# Patient Record
Sex: Female | Born: 1995 | Race: Black or African American | Hispanic: No | Marital: Single | Smoking: Never smoker
Health system: Southern US, Community
[De-identification: ages and names within clinical notes are randomized; demographics above are authoritative.]

## PROBLEM LIST (undated history)

## (undated) DIAGNOSIS — Z789 Other specified health status: Secondary | ICD-10-CM

---

## 2020-05-20 ENCOUNTER — Emergency Department (HOSPITAL_COMMUNITY)
Admission: EM | Admit: 2020-05-20 | Discharge: 2020-05-21 | Disposition: A | Discharge status: Home / Self Care | Payer: Medicaid Other | Attending: Emergency Medicine | Admitting: Emergency Medicine

## 2020-05-20 ENCOUNTER — Emergency Department (HOSPITAL_COMMUNITY): Payer: Medicaid Other

## 2020-05-20 ENCOUNTER — Other Ambulatory Visit: Payer: Self-pay

## 2020-05-20 DIAGNOSIS — R079 Chest pain, unspecified: Secondary | ICD-10-CM | POA: Insufficient documentation

## 2020-05-20 DIAGNOSIS — Z5321 Procedure and treatment not carried out due to patient leaving prior to being seen by health care provider: Secondary | ICD-10-CM | POA: Diagnosis not present

## 2020-05-20 DIAGNOSIS — N946 Dysmenorrhea, unspecified: Secondary | ICD-10-CM | POA: Insufficient documentation

## 2020-05-20 DIAGNOSIS — R0602 Shortness of breath: Secondary | ICD-10-CM | POA: Diagnosis not present

## 2020-05-20 LAB — CBC
HCT: 37.8 % (ref 36.0–46.0)
Hemoglobin: 12.1 g/dL (ref 12.0–15.0)
MCH: 27.9 pg (ref 26.0–34.0)
MCHC: 32 g/dL (ref 30.0–36.0)
MCV: 87.1 fL (ref 80.0–100.0)
Platelets: 245 10*3/uL (ref 150–400)
RBC: 4.34 MIL/uL (ref 3.87–5.11)
RDW: 15.9 % — ABNORMAL HIGH (ref 11.5–15.5)
WBC: 7.8 10*3/uL (ref 4.0–10.5)
nRBC: 0 % (ref 0.0–0.2)

## 2020-05-20 LAB — BASIC METABOLIC PANEL
Anion gap: 7 (ref 5–15)
BUN: 11 mg/dL (ref 6–20)
CO2: 25 mmol/L (ref 22–32)
Calcium: 9.3 mg/dL (ref 8.9–10.3)
Chloride: 106 mmol/L (ref 98–111)
Creatinine, Ser: 0.78 mg/dL (ref 0.44–1.00)
GFR calc Af Amer: >60 mL/min (ref >60)
GFR calc non Af Amer: >60 mL/min (ref >60)
Glucose, Bld: 95 mg/dL (ref 70–99)
Potassium: 4.1 mmol/L (ref 3.5–5.1)
Sodium: 138 mmol/L (ref 135–145)

## 2020-05-20 LAB — TROPONIN I (HIGH SENSITIVITY): Troponin I (High Sensitivity): <2 ng/L (ref <18)

## 2020-05-20 LAB — I-STAT BETA HCG BLOOD, ED (NOT ORDERABLE): I-stat hCG, quantitative: 21.9 m[IU]/mL — ABNORMAL HIGH (ref <5)

## 2020-05-20 LAB — HCG, QUANTITATIVE, PREGNANCY: hCG, Beta Chain, Quant, S: 21 m[IU]/mL — ABNORMAL HIGH (ref <5)

## 2020-05-20 MED ORDER — SODIUM CHLORIDE 0.9% FLUSH: 3.0000 mL | Freq: Once | INTRAVENOUS | Status: DC

## 2020-05-20 NOTE — ED Triage Notes (Signed)
Patient reports to the ER for c/o chest pain. Patient reports she had some SOB x1 week ago but the chest pain is new. Patient reports she is also having period cramps.

## 2021-04-07 ENCOUNTER — Emergency Department (HOSPITAL_COMMUNITY)
Admission: EM | Admit: 2021-04-07 | Discharge: 2021-04-09 | Disposition: A | Discharge status: Other Acute Inpatient Hospital | Payer: Medicaid Other | Attending: Emergency Medicine | Admitting: Emergency Medicine

## 2021-04-07 DIAGNOSIS — F29 Unspecified psychosis not due to a substance or known physiological condition: Secondary | ICD-10-CM | POA: Diagnosis not present

## 2021-04-07 DIAGNOSIS — F309 Manic episode, unspecified: Secondary | ICD-10-CM | POA: Insufficient documentation

## 2021-04-07 DIAGNOSIS — Z20822 Contact with and (suspected) exposure to covid-19: Secondary | ICD-10-CM | POA: Insufficient documentation

## 2021-04-07 DIAGNOSIS — Y900 Blood alcohol level of less than 20 mg/100 ml: Secondary | ICD-10-CM | POA: Diagnosis not present

## 2021-04-07 LAB — COMPREHENSIVE METABOLIC PANEL
ALT: 11 U/L (ref 0–44)
AST: 16 U/L (ref 15–41)
Albumin: 4.2 g/dL (ref 3.5–5.0)
Alkaline Phosphatase: 58 U/L (ref 38–126)
Anion gap: 7 (ref 5–15)
BUN: 10 mg/dL (ref 6–20)
CO2: 23 mmol/L (ref 22–32)
Calcium: 9.2 mg/dL (ref 8.9–10.3)
Chloride: 106 mmol/L (ref 98–111)
Creatinine, Ser: 0.86 mg/dL (ref 0.44–1.00)
GFR, Estimated: >60 mL/min (ref >60)
Glucose, Bld: 108 mg/dL — ABNORMAL HIGH (ref 70–99)
Potassium: 4 mmol/L (ref 3.5–5.1)
Sodium: 136 mmol/L (ref 135–145)
Total Bilirubin: 0.5 mg/dL (ref 0.3–1.2)
Total Protein: 7.6 g/dL (ref 6.5–8.1)

## 2021-04-07 LAB — RESP PANEL BY RT-PCR (FLU A&B, COVID) ARPGX2
Influenza A by PCR: NEGATIVE
Influenza B by PCR: NEGATIVE
SARS Coronavirus 2 by RT PCR: NEGATIVE

## 2021-04-07 LAB — CBC WITH DIFFERENTIAL/PLATELET
Abs Immature Granulocytes: 0.04 10*3/uL (ref 0.00–0.07)
Basophils Absolute: 0 10*3/uL (ref 0.0–0.1)
Basophils Relative: 0 %
Eosinophils Absolute: 0 10*3/uL (ref 0.0–0.5)
Eosinophils Relative: 0 %
HCT: 35.9 % — ABNORMAL LOW (ref 36.0–46.0)
Hemoglobin: 11.7 g/dL — ABNORMAL LOW (ref 12.0–15.0)
Immature Granulocytes: 0 %
Lymphocytes Relative: 15 %
Lymphs Abs: 1.4 10*3/uL (ref 0.7–4.0)
MCH: 28 pg (ref 26.0–34.0)
MCHC: 32.6 g/dL (ref 30.0–36.0)
MCV: 85.9 fL (ref 80.0–100.0)
Monocytes Absolute: 0.7 10*3/uL (ref 0.1–1.0)
Monocytes Relative: 8 %
Neutro Abs: 7.6 10*3/uL (ref 1.7–7.7)
Neutrophils Relative %: 77 %
Platelets: 289 10*3/uL (ref 150–400)
RBC: 4.18 MIL/uL (ref 3.87–5.11)
RDW: 15 % (ref 11.5–15.5)
WBC: 9.9 10*3/uL (ref 4.0–10.5)
nRBC: 0 % (ref 0.0–0.2)

## 2021-04-07 LAB — ETHANOL: Alcohol, Ethyl (B): <10 mg/dL (ref <10)

## 2021-04-07 LAB — I-STAT BETA HCG BLOOD, ED (MC, WL, AP ONLY): I-stat hCG, quantitative: <5 m[IU]/mL (ref <5)

## 2021-04-07 MED ORDER — ZIPRASIDONE MESYLATE 20 MG IM SOLR
20.0000 mg | Freq: Once | INTRAMUSCULAR | Status: AC
  Administered 2021-04-07: 20 mg via INTRAMUSCULAR
  Filled 2021-04-07: qty 20

## 2021-04-07 MED ORDER — STERILE WATER FOR INJECTION IJ SOLN
INTRAMUSCULAR | Status: AC
  Filled 2021-04-07: qty 10

## 2021-04-07 NOTE — ED Notes (Signed)
Patient walking outside and then returning inside, when asked to sit down and wait to be called patient walked back outside.

## 2021-04-07 NOTE — ED Notes (Signed)
Patient at desk making phone call to mother-Monique,RN  ?

## 2021-04-07 NOTE — ED Notes (Signed)
Pts belongings in locker #2 

## 2021-04-07 NOTE — ED Notes (Signed)
Pt is very agitated and aggressive. Pt is having hallucinations. This RN walked pt to [facility] without any issues.

## 2021-04-07 NOTE — ED Provider Notes (Signed)
[facility] EMERGENCY DEPARTMENT Provider Note   CSN: [id number] Arrival date & time: 04/07/21  1540     History No chief complaint on file.   Tammie Parker is a 25 y.o. female.  Presents to ER for psych eval.  Patient having frequent mild outbursts, hyperreligiosity.  States that she is interested in receiving help.  She denies any acute medical complaints but states that she does not feel right.  She denies thoughts of hurting herself or hurting others.  Denies hallucinations.  Does have reported history of bipolar disorder.  History is limited to her psychiatric condition.  HPI     No past medical history on file.  There are no problems to display for this patient.   OB History   No obstetric history on file.     No family history on file.     Home Medications Prior to Admission medications   Not on File    Allergies    Patient has no allergy information on record.  Review of Systems   Review of Systems  Constitutional:  Negative for chills and fever.  HENT:  Negative for ear pain and sore throat.   Eyes:  Negative for pain and visual disturbance.  Respiratory:  Negative for cough and shortness of breath.   Cardiovascular:  Negative for chest pain and palpitations.  Gastrointestinal:  Negative for abdominal pain and vomiting.  Genitourinary:  Negative for dysuria and hematuria.  Musculoskeletal:  Negative for arthralgias and back pain.  Skin:  Negative for color change and rash.  Neurological:  Negative for seizures and syncope.  All other systems reviewed and are negative.  Physical Exam Updated Vital Signs BP (!) 146/93 (BP Location: Right Arm)   Pulse (!) 130   Temp 98.7 F (37.1 C) (Oral)   Resp 18   SpO2 100%   Physical Exam Vitals and nursing note reviewed.  Constitutional:      General: She is not in acute distress.    Appearance: She is well-developed.     Comments: Obvious mania, pressured speech, difficult to  redirect  HENT:     Head: Normocephalic and atraumatic.  Eyes:     Conjunctiva/sclera: Conjunctivae normal.  Cardiovascular:     Rate and Rhythm: Normal rate and regular rhythm.     Heart sounds: No murmur heard. Pulmonary:     Effort: Pulmonary effort is normal. No respiratory distress.     Breath sounds: Normal breath sounds.  Abdominal:     Palpations: Abdomen is soft.     Tenderness: There is no abdominal tenderness.  Musculoskeletal:     Cervical back: Neck supple.  Skin:    General: Skin is warm and dry.  Neurological:     General: No focal deficit present.     Mental Status: She is alert.  Psychiatric:     Comments: Difficult to redirect, pressured speech, psychosis    ED Results / Procedures / Treatments   Labs (all labs ordered are listed, but only abnormal results are displayed) Labs Reviewed  CBC WITH DIFFERENTIAL/PLATELET - Abnormal; Notable for the following components:      Result Value   Hemoglobin 11.7 (*)    HCT 35.9 (*)    All other components within normal limits  RESP PANEL BY RT-PCR (FLU A&B, COVID) ARPGX2  COMPREHENSIVE METABOLIC PANEL  ETHANOL  RAPID URINE DRUG SCREEN, HOSP PERFORMED  I-STAT BETA HCG BLOOD, ED ([facility], [facility], [facility] ONLY)    EKG None  Radiology No results found.  Procedures Procedures   Medications Ordered in ED Medications  ziprasidone (GEODON) injection 20 mg (has no administration in time range)  sterile water (preservative free) injection (has no administration in time range)    ED Course  I have reviewed the triage vital signs and the nursing notes.  Pertinent labs & imaging results that were available during my care of the patient were reviewed by me and considered in my medical decision making (see chart for details).    MDM Rules/Calculators/A&P                          25 year old lady presents to ER with bizarre behavior.  Patient hyper religious, pressured speech, difficult to redirect.  Concern for profoundly  manic state, psychosis.  Denies any acute medical complaints.  We will send basic labs.  Will need TTS evaluation.  Patient is medically stable for psychiatry evaluation.  At present patient is voluntary and willing to receive treatment.  States she is willing to receive medication and willing to stay for psych eval.  If patient attempts to leave, likely will proceed with IVC but given for voluntary status not necessary at present.   Final Clinical Impression(s) / ED Diagnoses Final diagnoses:  Mania (HCC)  Psychosis, unspecified psychosis type [facility])    Rx / DC Orders ED Discharge Orders     None        Milagros Loll, MD 04/07/21 1827

## 2021-04-07 NOTE — ED Provider Notes (Signed)
Emergency Medicine Provider Triage Evaluation Note  Tammie Parker , a 25 y.o. female  was evaluated in triage.  Pt here by EMS from school after hyper religious statements and acting bizarre.  History of bipolar disorder.  She made comments about "reading too many books to know better", screaming "creativity", and "you again" to nobody.  She is crying and screaming.    She reports that she smoked a black and mild but no illicit drugs.  Wine last night, no other alcohol.  Would take trazodone for her bipolar disorder.    Conversations with herself.  Denies HI/SI.  Review of Systems  Positive: Hallucinations Negative: SI/HI  Physical Exam  BP (!) 146/93 (BP Location: Right Arm)   Pulse (!) 130   Temp 98.7 F (37.1 C) (Oral)   Resp 18   SpO2 100%  Gen:   Awake, no distress   Resp:  Normal effort  MSK:   Moves extremities without difficulty  Other:  Pacing around the room.  Anxious.  Responding to internal stimuli.  Screaming. Crying.  Medical Decision Making  Medically screening exam initiated at 4:49 PM.  Appropriate orders placed.  Tammie Parker was informed that the remainder of the evaluation will be completed by another provider, this initial triage assessment does not replace that evaluation, and the importance of remaining in the ED until their evaluation is complete.  Patient is having active psychosis.  Conversations with herself.  Emotionally labile.   Labs pending, but otherwise medically cleared.  Needs psychiatric stabilization.   Tammie New, PA-C 04/07/21 1650    Tammie Bale, MD 04/07/21 2118

## 2021-04-07 NOTE — ED Notes (Signed)
Pt here from [facility], upset, tearful, responding to external stimuli, interactive, labile, episodic echolalia. Agreeable to injection. Diet ordered. Belongings placed in locker #2 (1 bag).

## 2021-04-07 NOTE — ED Triage Notes (Signed)
Pt here from a school with ems drove up to the school and told the principal that god sent her there , pt denis SI/HI but has a hx of biploar and has several load outburst in triage but still cooperative

## 2021-04-08 ENCOUNTER — Other Ambulatory Visit: Payer: Self-pay

## 2021-04-08 LAB — RAPID URINE DRUG SCREEN, HOSP PERFORMED
Amphetamines: NOT DETECTED
Barbiturates: NOT DETECTED
Benzodiazepines: NOT DETECTED
Cocaine: NOT DETECTED
Opiates: NOT DETECTED
Tetrahydrocannabinol: NOT DETECTED

## 2021-04-08 MED ORDER — STERILE WATER FOR INJECTION IJ SOLN
INTRAMUSCULAR | Status: AC
  Filled 2021-04-08: qty 10

## 2021-04-08 MED ORDER — HYDROXYZINE HCL 25 MG PO TABS
25.0000 mg | ORAL_TABLET | Freq: Three times a day (TID) | ORAL | Status: DC | PRN
  Administered 2021-04-08 – 2021-04-09 (×3): 25 mg via ORAL
  Filled 2021-04-08 (×4): qty 1

## 2021-04-08 MED ORDER — ARIPIPRAZOLE 10 MG PO TABS
10.0000 mg | ORAL_TABLET | Freq: Every day | ORAL | Status: DC
  Administered 2021-04-08 – 2021-04-09 (×2): 10 mg via ORAL
  Filled 2021-04-08 (×2): qty 1

## 2021-04-08 MED ORDER — ACETAMINOPHEN 500 MG PO TABS
1000.0000 mg | ORAL_TABLET | Freq: Once | ORAL | Status: AC
  Administered 2021-04-08: 1000 mg via ORAL
  Filled 2021-04-08: qty 2

## 2021-04-08 MED ORDER — OLANZAPINE 5 MG PO TBDP
5.0000 mg | ORAL_TABLET | Freq: Three times a day (TID) | ORAL | Status: DC | PRN
  Administered 2021-04-08 – 2021-04-09 (×3): 5 mg via ORAL
  Filled 2021-04-08 (×3): qty 1

## 2021-04-08 MED ORDER — OLANZAPINE 10 MG IM SOLR: 5.0000 mg | Freq: Three times a day (TID) | INTRAMUSCULAR | Status: DC | PRN

## 2021-04-08 MED ORDER — ZIPRASIDONE MESYLATE 20 MG IM SOLR
20.0000 mg | Freq: Once | INTRAMUSCULAR | Status: AC
  Administered 2021-04-08: 20 mg via INTRAMUSCULAR
  Filled 2021-04-08: qty 20

## 2021-04-08 NOTE — Progress Notes (Signed)
Patient has been referred out due to no bed availability at [facility]. Patient meets inpatient criteria per [name] ,NP. Patient referred to the following facilities:  [facility]  [address] [place] [address] [phone]  [facility]  [address] [place] [address] [phone]  [facility]  [address], [place] [address] [phone]  [facility]  [address] [place] [address] [phone]  [facility]  [address] [place] [address] [phone]  [facility]  [address] [place] [address] [phone]  [facility]  [address], [place] [address] [phone]  [facility]  [address] [place], [place] [address] [phone]  [facility]  [address] [place], [place] [address] [phone]  [facility]  [address] [place] [address] [phone]  [facility]  [address] [place] [address] [phone]  [facility]  [address], [place] [address] [phone]  [facility]  [address], [place] [address] [phone]    CSW will continue to monitor disposition.    Damita Dunnings, MSW, LCSW-A  11:00 AM 04/08/2021

## 2021-04-08 NOTE — Consult Note (Signed)
Tammie Parker Charity fundraiser., verified pt's home medication Abilify 10 mg PO daily with pt. Pt states that the Abilify was effective but was unable to specify the last time she took the medication.  Medications started: Abilify 10 mg PO daily for mood stabilization  Vistaril 25 mg PO PRN TID for anxiety  Zyprexa 5 mg PO or IM for agitation   EKG ordered- patient is prescribed antipsychotics.  Secure chat sent to [facility] with updates.
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
## 2021-04-08 NOTE — ED Notes (Signed)
TTS set up

## 2021-04-08 NOTE — BH Assessment (Signed)
DISPOSITION: Gave clinical report to [name], NP who determined Pt meets criteria for inpatient psychiatric treatment. [name], AC at [facility] is reviewing for an appropriate bed is not currently available. Other facilities will be contacted for placement. Notified Dr. Madilyn Hook and [name] ,RN of disposition recommendation and the sitter utilization recommendation.

## 2021-04-08 NOTE — BH Assessment (Signed)
TTS consult completed 

## 2021-04-08 NOTE — ED Provider Notes (Signed)
Emergency Medicine Observation Re-evaluation Note  Tammie Parker is a 25 y.o. female, seen on rounds today.  Pt initially presented to the ED for complaints of No chief complaint on file. Currently, the patient is sleeping in the room. Nursing reports that she needed Geodon PRN for agitation this morning. Physical Exam  BP (!) 128/91 (BP Location: Right Arm)   Pulse 82   Temp 97.7 F (36.5 C) (Oral)   Resp 20   SpO2 100%  Physical Exam General: NAD Cardiac: RRR Lungs: no respiratory distress Psych: unable to assess - sleeping  ED Course / MDM  EKG:   I have reviewed the labs performed to date as well as medications administered while in observation.  Recent changes in the last 24 hours include Vistaril PRN as well as Zyprexa PRN added for anxiety and agitation.  Plan  Current plan is for psychiatric placement. Patient is  note under full IVC at this time.   Tilden Fossa, MD 04/08/21 1105

## 2021-04-08 NOTE — ED Notes (Signed)
Pt attempted to call mother

## 2021-04-08 NOTE — ED Notes (Signed)
Pt is waxing and waning between calm and tearful, anxiety.  PT is asking for something to help her sleep/stay calm.  Plan to give ODT zyprexa and Vistiril in hopes of helping her.

## 2021-04-08 NOTE — ED Notes (Signed)
Pt provided a care pkg containing eye cover, ear plugs, etc.

## 2021-04-08 NOTE — ED Notes (Signed)
Mother called and number to reach is [phone]

## 2021-04-08 NOTE — Progress Notes (Signed)
Pt accepted to [facility]     Patient meets inpatient criteria per [name], NP  Dr.Thomas Loyola Mast is the attending provider.    Call report to [phone]   [name], RN @ [facility] ED notified.     Pt scheduled  to arrive at [facility] tomorrow after 0800.   Damita Dunnings, MSW, LCSW-A  11:10 AM 04/08/2021

## 2021-04-09 MED ORDER — LORAZEPAM 1 MG PO TABS
1.0000 mg | ORAL_TABLET | Freq: Once | ORAL | Status: AC
  Administered 2021-04-09: 1 mg via ORAL
  Filled 2021-04-09: qty 1

## 2021-04-09 NOTE — ED Notes (Signed)
Attempted report to [facility] several times, was given different numbers to try calling by house supervisor. Admissions states that there is no nurse there with him and that this RN will have to call back after 8AM. Charge RN notified.

## 2021-04-09 NOTE — ED Notes (Signed)
Pt is awake, hungry and impatiently awaiting a snack to assist deescalation.  This RN ok'd snack as there are no meds to be given at this time and pt is not in a reasonable state at this time.

## 2021-04-09 NOTE — ED Notes (Signed)
Attempted report to [facility] again. Callback number provided.

## 2021-04-09 NOTE — ED Notes (Signed)
Patient found standing in room with head in sink. This RN asked patient what she was doing and patient stated she just wanted to wash her hair. This RN told patient she could have a shower once it was no longer occupied by another patient. Patient agreeable.

## 2021-04-09 NOTE — ED Notes (Signed)
Attempted to call report to [facility] several times with no answer. Will attempt again soon.

## 2021-04-09 NOTE — ED Notes (Signed)
Pt was becoming quite agitated and paranoid and was beginning to curse at myself and the sitter.  PT was asking for medicine.  This RN gave Vistiril at 2145 and informed pt that she would not be able to receive ODT Zyprexa for another hour or so.

## 2021-04-09 NOTE — ED Notes (Addendum)
Pt has been up and down all afternoon and evening. She sings, she raps, she cries, she laughs, she claps.  She tends to be a little paranoid and easily startled or frustrated, but is generally redirectable.  Pt agitation has improved since receiving Vistiril but is still very active.  Will plan to give Zyprexa soon in hopes of encouraging sleep for the night.

## 2021-04-09 NOTE — ED Provider Notes (Signed)
Emergency Medicine Observation Re-evaluation Note  Tammie Parker is a 25 y.o. female, seen on rounds today.  Pt initially presented to the ED for complaints of No chief complaint on file. Currently, the patient is awake and sitting at the bedside.  Physical Exam  BP (!) 146/89   Pulse (!) 103   Temp 97.6 F (36.4 C) (Oral)   Resp 18   Ht 5\' 9"  (1.753 m)   Wt 80.7 kg   SpO2 97%   BMI 26.27 kg/m  Physical Exam General: sitting on bedside, anxious Cardiac: RRR Lungs: no respiratory distress Psych: anxious, tearful  ED Course / MDM  EKG:   I have reviewed the labs performed to date as well as medications administered while in observation.  Recent changes in the last 24 hours include none.  Plan  Current plan is for transfer to [facility] for ongoing psychiatric care. Patient is not under full IVC at this time.   [facility], MD 04/09/21 [phone]

## 2021-04-09 NOTE — ED Notes (Signed)
Patient back and forth to restroom and out of room; sitting in chair in hallway, clapping hands and singing. PRN hydroxyzine administered.

## 2021-12-14 IMAGING — CR DG CHEST 2V
2 series · 2 of 2 positions shown · non-contrast
Comparison: None.

CLINICAL DATA: Chest pain

EXAM:
CHEST - 2 VIEW

[w chest pa]
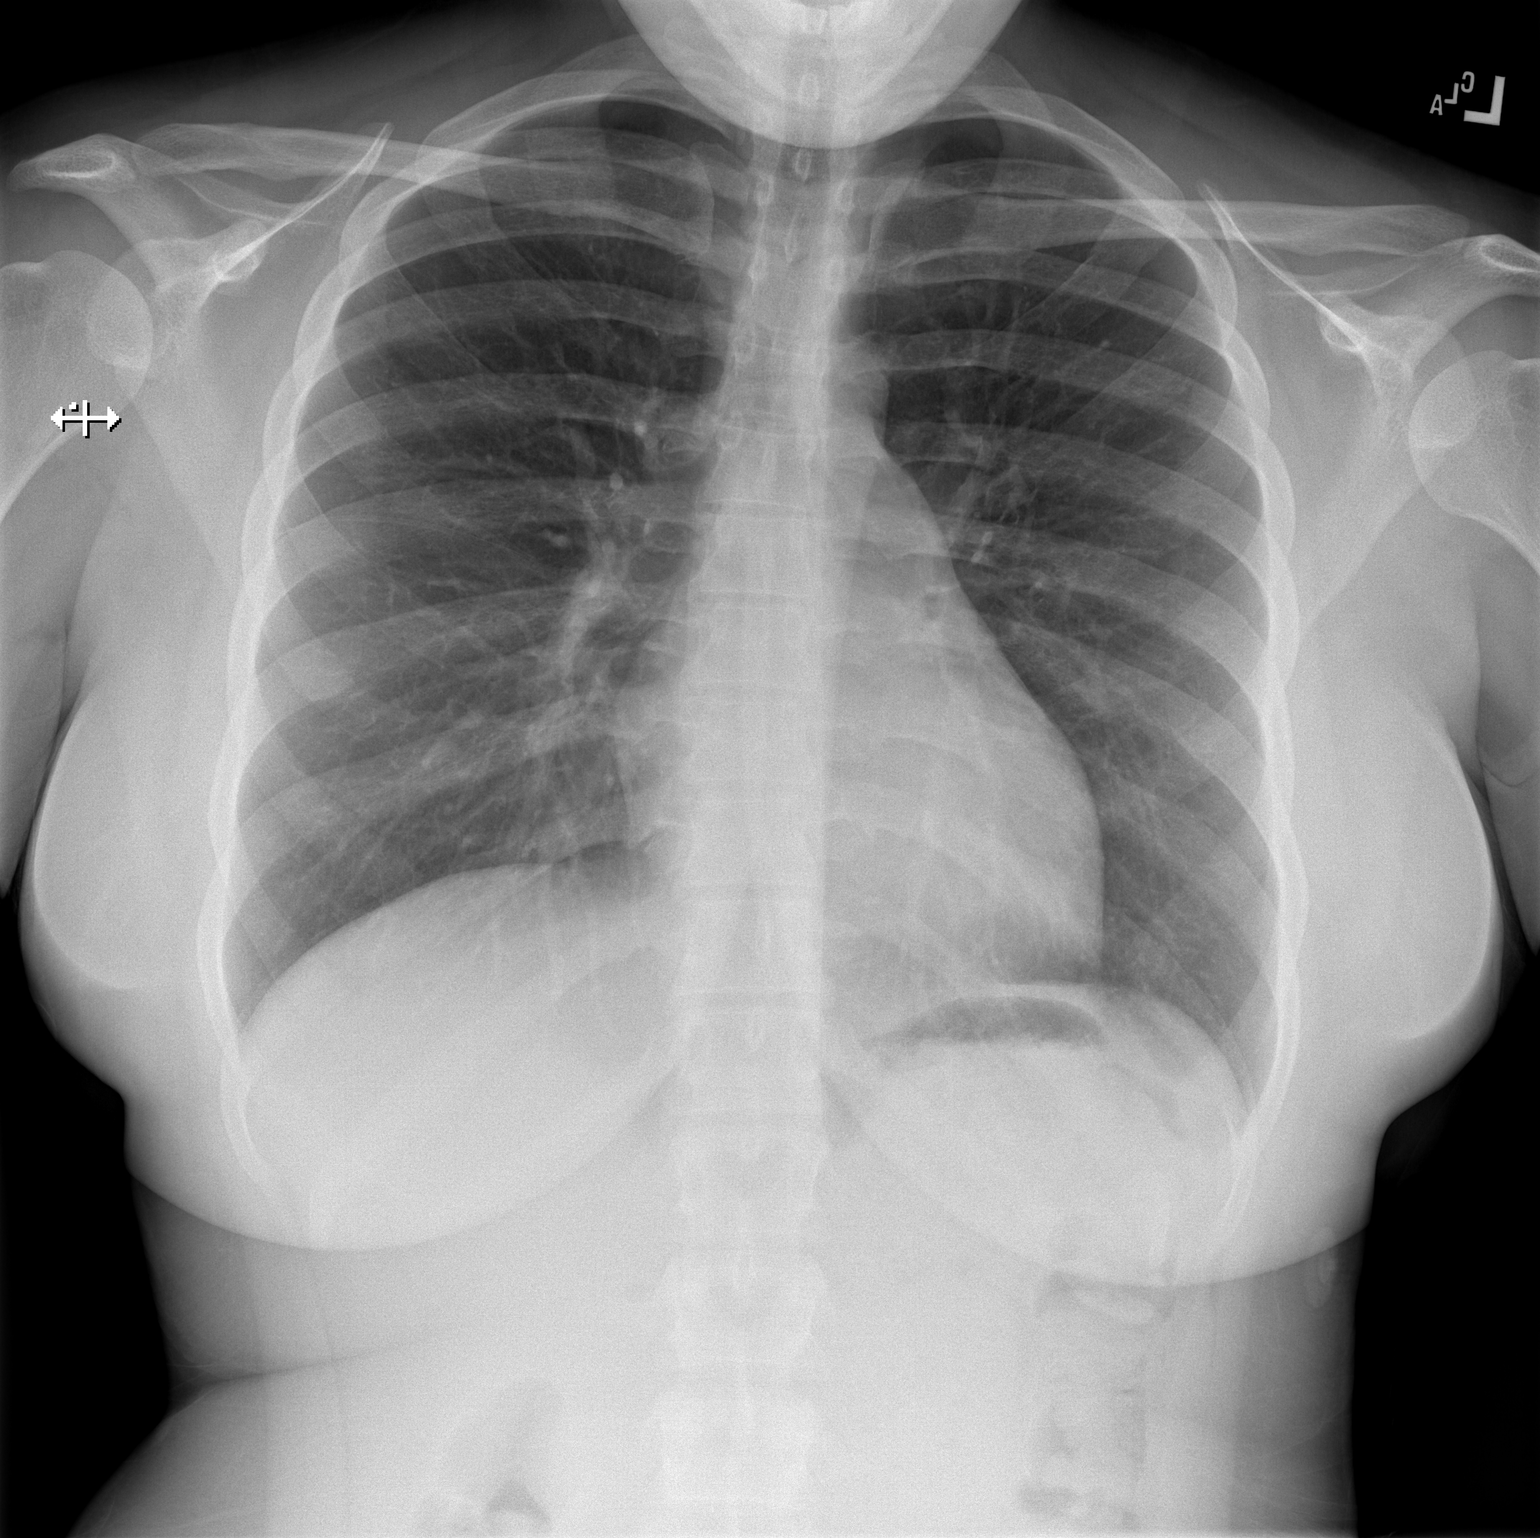

[w chest lat]
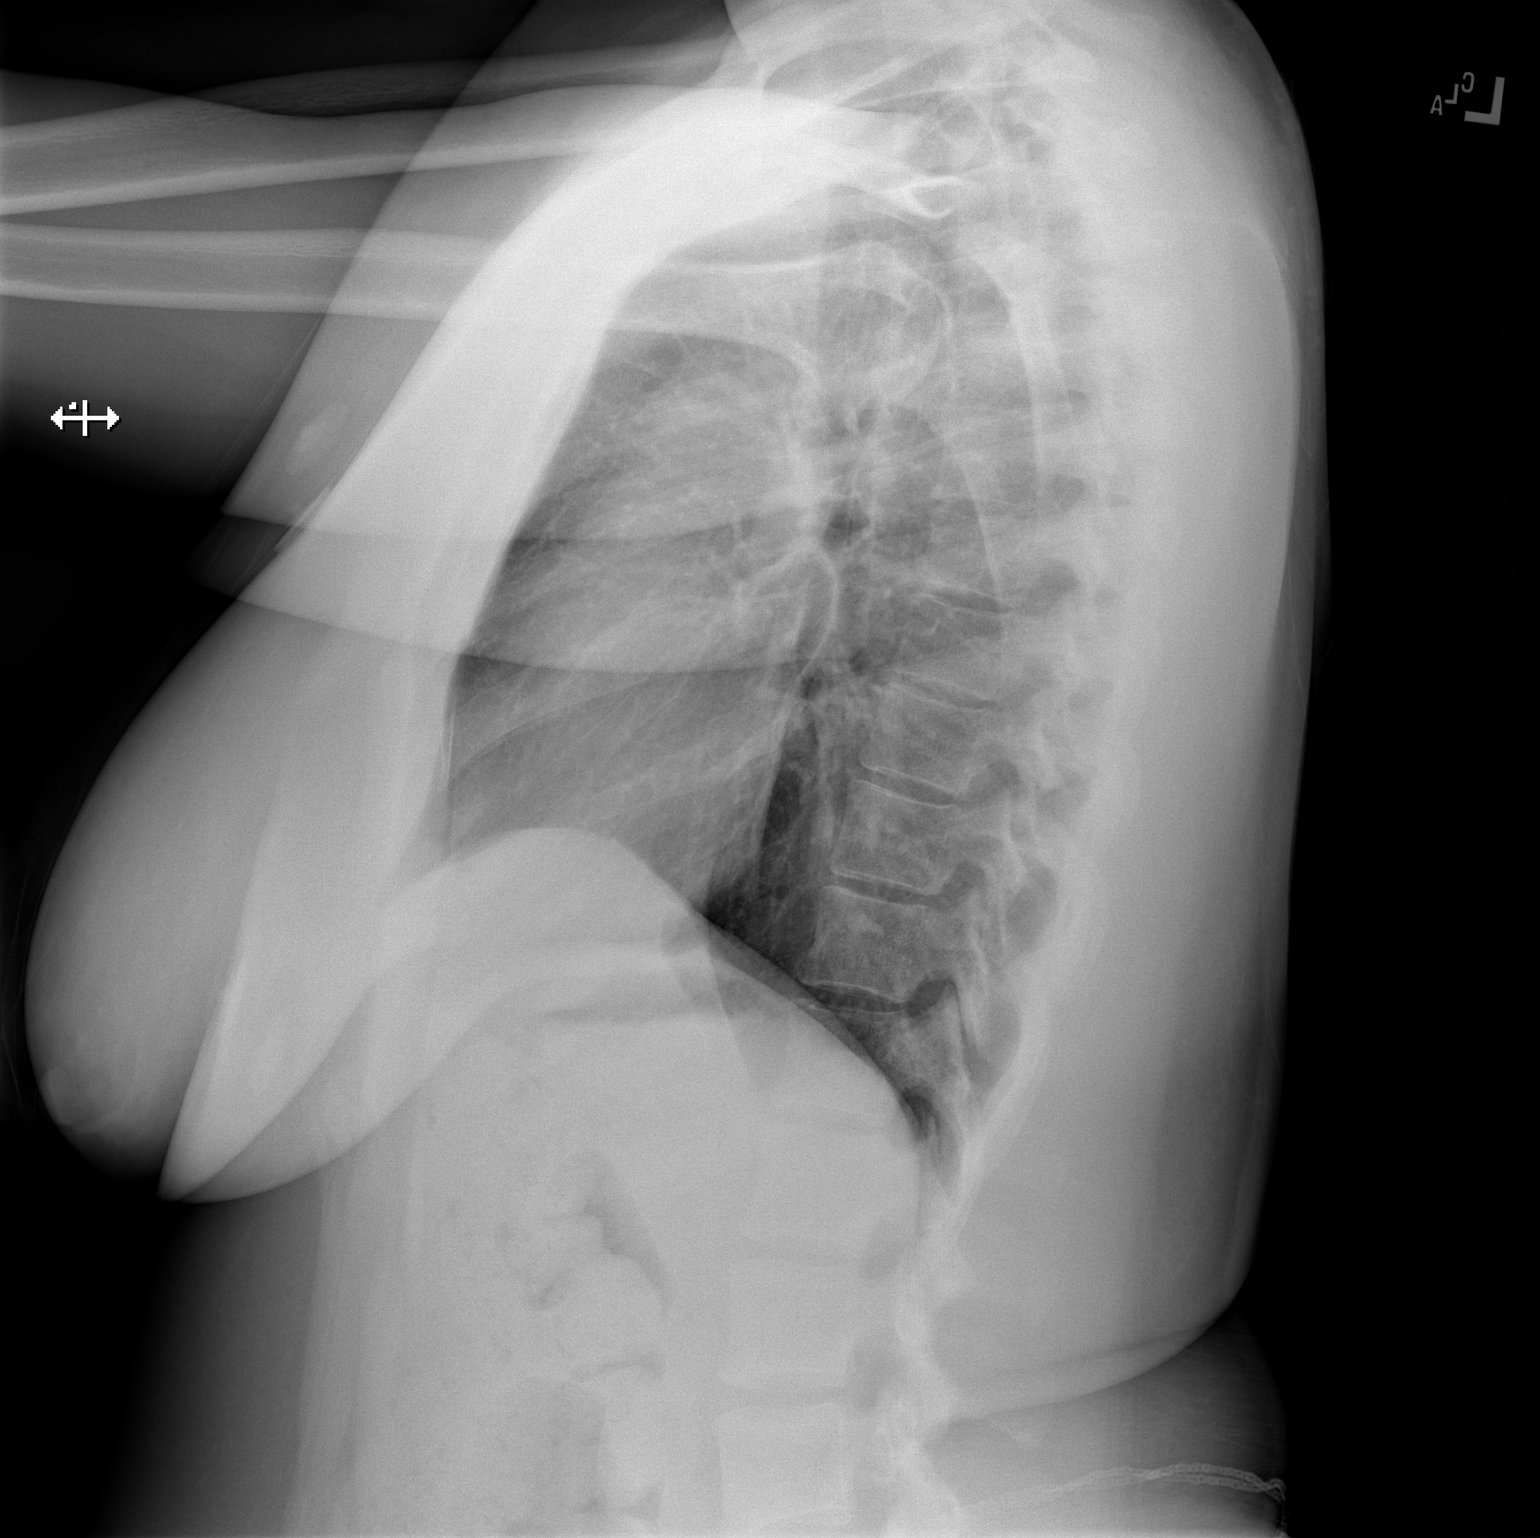

[2 of 2 positions shown; findings below may reference images not displayed]

FINDINGS: Lungs are clear. Heart size and pulmonary vascularity are normal. No
adenopathy. No pneumothorax. No bone lesions.
IMPRESSION: Lungs clear.  Cardiac silhouette normal.

## 2022-04-18 ENCOUNTER — Emergency Department (HOSPITAL_COMMUNITY)
Admission: EM | Admit: 2022-04-18 | Discharge: 2022-04-19 | Disposition: A | Discharge status: Home / Self Care | Payer: Medicaid Other | Attending: Emergency Medicine | Admitting: Emergency Medicine

## 2022-04-18 DIAGNOSIS — Z20822 Contact with and (suspected) exposure to covid-19: Secondary | ICD-10-CM | POA: Diagnosis not present

## 2022-04-18 DIAGNOSIS — F309 Manic episode, unspecified: Secondary | ICD-10-CM | POA: Insufficient documentation

## 2022-04-18 DIAGNOSIS — R45851 Suicidal ideations: Secondary | ICD-10-CM | POA: Insufficient documentation

## 2022-04-18 DIAGNOSIS — F19959 Other psychoactive substance use, unspecified with psychoactive substance-induced psychotic disorder, unspecified: Secondary | ICD-10-CM | POA: Diagnosis not present

## 2022-04-18 DIAGNOSIS — F29 Unspecified psychosis not due to a substance or known physiological condition: Secondary | ICD-10-CM | POA: Diagnosis present

## 2022-04-18 LAB — RAPID URINE DRUG SCREEN, HOSP PERFORMED
Amphetamines: POSITIVE — AB
Barbiturates: NOT DETECTED
Benzodiazepines: NOT DETECTED
Cocaine: NOT DETECTED
Opiates: NOT DETECTED
Tetrahydrocannabinol: POSITIVE — AB

## 2022-04-18 LAB — CBC WITH DIFFERENTIAL/PLATELET
Abs Immature Granulocytes: 0.03 10*3/uL (ref 0.00–0.07)
Basophils Absolute: 0 10*3/uL (ref 0.0–0.1)
Basophils Relative: 0 %
Eosinophils Absolute: 0 10*3/uL (ref 0.0–0.5)
Eosinophils Relative: 0 %
HCT: 34.1 % — ABNORMAL LOW (ref 36.0–46.0)
Hemoglobin: 11.4 g/dL — ABNORMAL LOW (ref 12.0–15.0)
Immature Granulocytes: 0 %
Lymphocytes Relative: 14 %
Lymphs Abs: 1.2 10*3/uL (ref 0.7–4.0)
MCH: 29.5 pg (ref 26.0–34.0)
MCHC: 33.4 g/dL (ref 30.0–36.0)
MCV: 88.1 fL (ref 80.0–100.0)
Monocytes Absolute: 0.5 10*3/uL (ref 0.1–1.0)
Monocytes Relative: 6 %
Neutro Abs: 6.8 10*3/uL (ref 1.7–7.7)
Neutrophils Relative %: 80 %
Platelets: 261 10*3/uL (ref 150–400)
RBC: 3.87 MIL/uL (ref 3.87–5.11)
RDW: 14.1 % (ref 11.5–15.5)
WBC: 8.5 10*3/uL (ref 4.0–10.5)
nRBC: 0 % (ref 0.0–0.2)

## 2022-04-18 LAB — I-STAT BETA HCG BLOOD, ED (MC, WL, AP ONLY): I-stat hCG, quantitative: <5 m[IU]/mL (ref <5)

## 2022-04-18 LAB — COMPREHENSIVE METABOLIC PANEL
ALT: 21 U/L (ref 0–44)
AST: 24 U/L (ref 15–41)
Albumin: 4.2 g/dL (ref 3.5–5.0)
Alkaline Phosphatase: 51 U/L (ref 38–126)
Anion gap: 7 (ref 5–15)
BUN: 8 mg/dL (ref 6–20)
CO2: 20 mmol/L — ABNORMAL LOW (ref 22–32)
Calcium: 8.9 mg/dL (ref 8.9–10.3)
Chloride: 113 mmol/L — ABNORMAL HIGH (ref 98–111)
Creatinine, Ser: 0.73 mg/dL (ref 0.44–1.00)
GFR, Estimated: >60 mL/min (ref >60)
Glucose, Bld: 105 mg/dL — ABNORMAL HIGH (ref 70–99)
Potassium: 3.2 mmol/L — ABNORMAL LOW (ref 3.5–5.1)
Sodium: 140 mmol/L (ref 135–145)
Total Bilirubin: 0.6 mg/dL (ref 0.3–1.2)
Total Protein: 7.4 g/dL (ref 6.5–8.1)

## 2022-04-18 LAB — RESP PANEL BY RT-PCR (FLU A&B, COVID) ARPGX2
Influenza A by PCR: NEGATIVE
Influenza B by PCR: NEGATIVE
SARS Coronavirus 2 by RT PCR: NEGATIVE

## 2022-04-18 LAB — ETHANOL: Alcohol, Ethyl (B): <10 mg/dL (ref <10)

## 2022-04-18 MED ORDER — LORAZEPAM 2 MG/ML IJ SOLN
1.0000 mg | Freq: Once | INTRAMUSCULAR | Status: AC
  Administered 2022-04-18: 1 mg via INTRAMUSCULAR
  Filled 2022-04-18: qty 1

## 2022-04-18 MED ORDER — HALOPERIDOL LACTATE 5 MG/ML IJ SOLN: 5.0000 mg | Freq: Once | INTRAMUSCULAR | Status: DC

## 2022-04-18 MED ORDER — LORAZEPAM 2 MG/ML IJ SOLN: 1.0000 mg | Freq: Once | INTRAMUSCULAR | Status: DC

## 2022-04-18 NOTE — ED Provider Notes (Signed)
[facility]-EMERGENCY DEPT Provider Note   CSN: [id number] Arrival date & time: 04/18/22  2025     History  Chief Complaint  Patient presents with   Psychiatric Evaluation    Tammie Parker is a 26 y.o. female.  Patient here with suicidal thoughts.  Admits to drinking alcohol last night.  Was given Haldol by EMS for some agitation.  History of schizophrenia.  Patient denies any chest pain or shortness of breath or abdominal pain.  Has no specific plan to hurt herself.  Nothing makes it worse or better.  She states she lives by herself.  She denies any other drug use.  Denies taking any pills or anything else to hurt herself.  The history is provided by the patient.       Home Medications Prior to Admission medications   Medication Sig Start Date End Date Taking? Authorizing Provider  ARIPiprazole (ABILIFY) 10 MG tablet Take 10 mg by mouth daily. Patient not taking: No sig reported 03/07/21   [provider]  melatonin 3 MG TABS tablet Take 3 mg by mouth at bedtime. 03/07/21   [provider]  traZODone (DESYREL) 50 MG tablet Take 50 mg by mouth at bedtime. 03/07/21   [provider]      Allergies    Ziprasidone hcl    Review of Systems   Review of Systems  Physical Exam Updated Vital Signs BP 127/71   Pulse 89   Temp 98.3 F (36.8 C) (Oral)   Resp 18   Ht 5\' 9"  (1.753 m)   Wt 109.8 kg   SpO2 100%   BMI 35.74 kg/m  Physical Exam Vitals and nursing note reviewed.  Constitutional:      General: She is not in acute distress.    Appearance: She is well-developed.  HENT:     Head: Normocephalic and atraumatic.  Eyes:     Extraocular Movements: Extraocular movements intact.     Conjunctiva/sclera: Conjunctivae normal.     Pupils: Pupils are equal, round, and reactive to light.  Cardiovascular:     Rate and Rhythm: Normal rate and regular rhythm.     Pulses: Normal pulses.     Heart sounds: No murmur  heard. Pulmonary:     Effort: Pulmonary effort is normal. No respiratory distress.     Breath sounds: Normal breath sounds.  Abdominal:     Palpations: Abdomen is soft.     Tenderness: There is no abdominal tenderness.  Musculoskeletal:        General: No swelling.     Cervical back: Neck supple.  Skin:    General: Skin is warm and dry.     Capillary Refill: Capillary refill takes less than 2 seconds.  Neurological:     Mental Status: She is alert.  Psychiatric:     Comments: Suicidal, mildly sedated     ED Results / Procedures / Treatments   Labs (all labs ordered are listed, but only abnormal results are displayed) Labs Reviewed  COMPREHENSIVE METABOLIC PANEL - Abnormal; Notable for the following components:      Result Value   Potassium 3.2 (*)    Chloride 113 (*)    CO2 20 (*)    Glucose, Bld 105 (*)    All other components within normal limits  RAPID URINE DRUG SCREEN, HOSP PERFORMED - Abnormal; Notable for the following components:   Amphetamines POSITIVE (*)    Tetrahydrocannabinol POSITIVE (*)    All other components within  normal limits  CBC WITH DIFFERENTIAL/PLATELET - Abnormal; Notable for the following components:   Hemoglobin 11.4 (*)    HCT 34.1 (*)    All other components within normal limits  RESP PANEL BY RT-PCR (FLU A&B, COVID) ARPGX2  ETHANOL  I-STAT BETA HCG BLOOD, ED ([facility], [facility], [facility] ONLY)    EKG None  Radiology No results found.  Procedures Procedures    Medications Ordered in ED Medications  haloperidol lactate (HALDOL) injection 5 mg (has no administration in time range)  LORazepam (ATIVAN) injection 1 mg (has no administration in time range)    ED Course/ Medical Decision Making/ A&P                           Medical Decision Making Amount and/or Complexity of Data Reviewed Labs: ordered.  Risk Prescription drug management.   Tammie Parker is here with suicidal ideation.  Appears to have history of schizophrenia.  Admits  alcohol use today.  Normal vitals.  Neurologically she appears to be intact.  No signs of trauma.  She had Haldol with EMS due to agitation.  Overall she appears mildly intoxicated.  We will check medical clearance labs including CBC, CMP, drug screen, ethanol level.  I have no concern for traumatic processes at this time.  She appears medically stable.  We will get screening labs and have her seen by psychiatry.  She is voluntary at this time.  Per my review and interpretation of labs there is no significant electrolyte abnormality, kidney injury, leukocytosis.  Medically cleared.  We will have psychiatry evaluate.  Positive for amphetamines and marijuana.  Does take Adderall.  This chart was dictated using voice recognition software.  Despite best efforts to proofread,  errors can occur which can change the documentation meaning.         Final Clinical Impression(s) / ED Diagnoses Final diagnoses:  Suicidal ideation    Rx / DC Orders ED Discharge Orders     None         Virgina Norfolk, DO 04/18/22 4315

## 2022-04-18 NOTE — ED Notes (Signed)
Pt removed IV.

## 2022-04-18 NOTE — ED Triage Notes (Signed)
Ems brings pt in for psych evaluation. Pt reports hallucinations.

## 2022-04-18 NOTE — ED Notes (Signed)
Pt belongings in 5-8 cabinet

## 2022-04-18 NOTE — Consult Note (Cosign Needed Addendum)
Attempt to evaluate patient failed due to inability to arouse.  Mother at the bed side said she was given medication -Haldol on her way to the ER.  Will try later to assess.

## 2022-04-18 NOTE — ED Notes (Signed)
Pt given lunch tray.

## 2022-04-19 DIAGNOSIS — F19959 Other psychoactive substance use, unspecified with psychoactive substance-induced psychotic disorder, unspecified: Secondary | ICD-10-CM | POA: Diagnosis present

## 2022-04-19 NOTE — ED Notes (Signed)
AVS provided to and discussed with patient. Pt verbalizes understanding of discharge instructions and denies any questions or concerns at this time. Pt has ride home. Pt ambulated out of department independently with steady gait.  

## 2022-04-19 NOTE — ED Notes (Signed)
Patient was given her breakfast tray.

## 2022-04-19 NOTE — Progress Notes (Signed)
Chaplain engaged in a conversation with [facility] when she became disturbed by noise on the unit.  She is feeling better and is looking forward to being at home.  She said she needs to reflect on all that has happened and plans to get a therapist when she discharges.  Chaplain inquired if she had been to a therapist before and she said no.  Chaplain asked her if she needed resources.  She said that she and her mother would figure it out. Chaplain provided listening and emotional support  Chaplain Dyanne Carrel, Bcc Pager, [phone]

## 2022-04-19 NOTE — ED Provider Notes (Signed)
Evaluated psychiatry team.  Cleared for continued outpatient care.  Advised return for worsening symptoms or any additional concerns.   Cheryll Cockayne, MD 04/19/22 1146

## 2022-04-19 NOTE — Discharge Instructions (Signed)
For your behavioral health needs you are advised to follow up with [facility].  They offer psychiatry and therapy.  They also offer a Substance Abuse Intensive Outpatient Program (SA-IOP) to treat substance use disorders.  Contact them at your earliest opportunity to schedule an intake appointment:       [facility]      [address] [place], [place] [address]      [phone]

## 2022-04-19 NOTE — BH Assessment (Signed)
[facility] Assessment Progress Note   Per Dahlia Byes, NP, this voluntary pt does not require psychiatric hospitalization at this time.  Pt is psychiatrically cleared.  Discharge instructions advise pt to follow up with the [facility] for psychiatry, therapy and SA-IOP at her earliest opportunity.  EDP Norman Clay, MD and pt's nurse, [place], have been notified.  Doylene Canning, MA Triage Specialist [phone]

## 2022-04-19 NOTE — BH Assessment (Signed)
Clinician messaged Kavin Leech. Tiburcio Pea, RN: "Hey. It's Trey with TTS. Is the pt able to engage in the assessment, if so the pt will need to be placed in a private room. Is the pt under IVC? Also is the pt medically cleared?"   Clinician awaiting response.    Redmond Pulling, MS, [facility], [facility] Triage Specialist [phone]
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
## 2022-04-19 NOTE — ED Notes (Signed)
Mother called, will bring patient's belongings and clothes to go home in. Mother to pick up patient at 12:30. Pt updated.
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
## 2022-09-07 NOTE — H&P (Addendum)
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   Glucose, POC 111 70 - 179 mg/dL  POCT Glucose  Result Value Ref Range   Glucose, POC 110 70 - 179 mg/dL  POCT Glucose  Result Value Ref Range   Glucose, POC 128 70 - 179 mg/dL  CBC w/ Differential  Result Value Ref Range   WBC 25.0 (H) 3.6 - 11.2 10*9/L   RBC 3.57 (L) 3.95 - 5.13 10*12/L   HGB 10.2 (L) 11.3 - 14.9 g/dL   HCT 69.3 (L) 65.9 - 55.9 %   MCV 85.8 77.6 - 95.7 fL   MCH 28.6 25.9 - 32.4 pg   MCHC 33.3 32.0 - 36.0 g/dL   RDW 85.2 87.7 - 84.7 %   MPV 8.6 6.8 - 10.7 fL   Platelet 317 150 - 450 10*9/L   Neutrophils % 85.7 %   Lymphocytes % 9.7 %   Monocytes % 4.4 %   Eosinophils % 0.1 %  Basophils % 0.1 %   Absolute Neutrophils 21.4 (H) 1.8 - 7.8 10*9/L   Absolute Lymphocytes 2.4 1.1 - 3.6 10*9/L   Absolute Monocytes 1.1 (H) 0.3 - 0.8 10*9/L   Absolute Eosinophils 0.0 0.0 - 0.5 10*9/L   Absolute Basophils 0.0 0.0 - 0.1 10*9/L     Psychometrics: To be completed per unit protocol

## 2022-09-16 NOTE — Discharge Summary (Signed)
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 Health http://www.maynard.net/  Substance Abuse and Mental Health Services Administration skateoasis.com.pt   -For immediate concerns, call 911 for emergency medical attention.  -Emergency services are available 24 hours a day at [facility] to get assistance in deciding appropriate care during periods of psychiatric concern.  -Consider a psychiatric advanced directive at http://nrc-pad.org/     Follow Up instructions and Outpatient Referrals    Ambulatory referral to Neurology     Discharge instructions     Ambulatory referral to Dentistry     To what location is this patient being referred?: [facility]  (Dentistry[facility] [facility]   Do you want ongoing co-management?: No   Care coordination required?: No    Appointments which have been scheduled for you    Oct 19, 2022  1:30 PM (Arrive by 1:15 PM) US  RENAL COMPLETE with [facility] US  RM 2 IMG ULTRASOUND RENNIE [facility]) [address] [place] [address] [phone]      Oct 19, 2022  2:30 PM (Arrive by 2:15 PM) RETURN GENERAL with Aimee Almarie Cassette, PA [facility] UROLOGY [facility]) [address]rd Floor [place] [address] [phone]    Additional instructions:   [facility] follow  Virtual 09/19/22 1:30pm [address] [place] Phone [phone] Fax [phone] Virtual link is being sent to your phone. If this is an issue you may also walk in Monday-Friday at 7:15am at this location for initiation of services       Patient was seen and plan of care was discussed with the Attending MD, Dr. Macy , who agrees with the above statement and plan.  I spent greater than 30 minutes in the discharge of this patient.  Dozier Lance, MD PGY-1 Resident, Psychiatry [facility] 

## 2022-09-19 ENCOUNTER — Telehealth (INDEPENDENT_AMBULATORY_CARE_PROVIDER_SITE_OTHER): Payer: Medicaid Other | Admitting: Psychiatry

## 2022-09-19 ENCOUNTER — Encounter (HOSPITAL_COMMUNITY): Payer: Self-pay | Admitting: Psychiatry

## 2022-09-19 DIAGNOSIS — F209 Schizophrenia, unspecified: Secondary | ICD-10-CM | POA: Diagnosis not present

## 2022-09-19 MED ORDER — OLANZAPINE 10 MG PO TABS: 10.0000 mg | ORAL_TABLET | Freq: Every day | ORAL | 3 refills | Status: DC
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
## 2022-12-15 ENCOUNTER — Telehealth (HOSPITAL_COMMUNITY): Payer: Medicaid Other | Admitting: Student in an Organized Health Care Education/Training Program

## 2022-12-15 ENCOUNTER — Encounter (HOSPITAL_COMMUNITY): Payer: Self-pay
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
## 2023-04-17 ENCOUNTER — Ambulatory Visit (HOSPITAL_COMMUNITY)
Admission: EM | Admit: 2023-04-17 | Discharge: 2023-04-17 | Disposition: A | Discharge status: Other Acute Inpatient Hospital | Payer: MEDICAID | Attending: Family | Admitting: Family

## 2023-04-17 ENCOUNTER — Encounter (HOSPITAL_COMMUNITY): Payer: Self-pay | Admitting: Psychiatry

## 2023-04-17 ENCOUNTER — Other Ambulatory Visit: Payer: Self-pay

## 2023-04-17 ENCOUNTER — Inpatient Hospital Stay (HOSPITAL_COMMUNITY)
Admission: AD | Admit: 2023-04-17 | Discharge: 2023-04-23 | DRG: 885 | Disposition: A | Discharge status: Home / Self Care | Payer: MEDICAID | Source: Intra-hospital | Attending: Psychiatry | Admitting: Psychiatry

## 2023-04-17 DIAGNOSIS — Z79899 Other long term (current) drug therapy: Secondary | ICD-10-CM | POA: Diagnosis not present

## 2023-04-17 DIAGNOSIS — F19959 Other psychoactive substance use, unspecified with psychoactive substance-induced psychotic disorder, unspecified: Secondary | ICD-10-CM | POA: Diagnosis not present

## 2023-04-17 DIAGNOSIS — F17210 Nicotine dependence, cigarettes, uncomplicated: Secondary | ICD-10-CM | POA: Diagnosis present

## 2023-04-17 DIAGNOSIS — F411 Generalized anxiety disorder: Secondary | ICD-10-CM | POA: Insufficient documentation

## 2023-04-17 DIAGNOSIS — F4310 Post-traumatic stress disorder, unspecified: Secondary | ICD-10-CM | POA: Diagnosis present

## 2023-04-17 DIAGNOSIS — Z91199 Patient's noncompliance with other medical treatment and regimen due to unspecified reason: Secondary | ICD-10-CM | POA: Diagnosis not present

## 2023-04-17 DIAGNOSIS — F840 Autistic disorder: Secondary | ICD-10-CM | POA: Diagnosis present

## 2023-04-17 DIAGNOSIS — Z5986 Financial insecurity: Secondary | ICD-10-CM | POA: Diagnosis not present

## 2023-04-17 DIAGNOSIS — F319 Bipolar disorder, unspecified: Secondary | ICD-10-CM | POA: Insufficient documentation

## 2023-04-17 DIAGNOSIS — R45851 Suicidal ideations: Secondary | ICD-10-CM | POA: Diagnosis present

## 2023-04-17 DIAGNOSIS — F250 Schizoaffective disorder, bipolar type: Secondary | ICD-10-CM | POA: Diagnosis present

## 2023-04-17 DIAGNOSIS — F209 Schizophrenia, unspecified: Secondary | ICD-10-CM | POA: Insufficient documentation

## 2023-04-17 HISTORY — DX: Other specified health status: Z78.9

## 2023-04-17 LAB — CBC WITH DIFFERENTIAL/PLATELET
Abs Immature Granulocytes: 0.03 10*3/uL (ref 0.00–0.07)
Basophils Absolute: 0 10*3/uL (ref 0.0–0.1)
Basophils Relative: 0 %
Eosinophils Absolute: 0 10*3/uL (ref 0.0–0.5)
Eosinophils Relative: 0 %
HCT: 36.5 % (ref 36.0–46.0)
Hemoglobin: 12.1 g/dL (ref 12.0–15.0)
Immature Granulocytes: 0 %
Lymphocytes Relative: 14 %
Lymphs Abs: 1.1 10*3/uL (ref 0.7–4.0)
MCH: 28.9 pg (ref 26.0–34.0)
MCHC: 33.2 g/dL (ref 30.0–36.0)
MCV: 87.1 fL (ref 80.0–100.0)
Monocytes Absolute: 0.3 10*3/uL (ref 0.1–1.0)
Monocytes Relative: 4 %
Neutro Abs: 6.1 10*3/uL (ref 1.7–7.7)
Neutrophils Relative %: 82 %
Platelets: 280 10*3/uL (ref 150–400)
RBC: 4.19 MIL/uL (ref 3.87–5.11)
RDW: 14.7 % (ref 11.5–15.5)
WBC: 7.5 10*3/uL (ref 4.0–10.5)
nRBC: 0 % (ref 0.0–0.2)

## 2023-04-17 LAB — POCT URINE DRUG SCREEN - MANUAL ENTRY (I-SCREEN)
POC Amphetamine UR: NOT DETECTED
POC Buprenorphine (BUP): NOT DETECTED
POC Cocaine UR: NOT DETECTED
POC Marijuana UR: POSITIVE — AB
POC Methadone UR: NOT DETECTED
POC Methamphetamine UR: NOT DETECTED
POC Morphine: NOT DETECTED
POC Oxazepam (BZO): NOT DETECTED
POC Oxycodone UR: NOT DETECTED
POC Secobarbital (BAR): NOT DETECTED

## 2023-04-17 LAB — COMPREHENSIVE METABOLIC PANEL
ALT: 21 U/L (ref 0–44)
AST: 24 U/L (ref 15–41)
Albumin: 4.2 g/dL (ref 3.5–5.0)
Alkaline Phosphatase: 64 U/L (ref 38–126)
Anion gap: 8 (ref 5–15)
BUN: 9 mg/dL (ref 6–20)
CO2: 22 mmol/L (ref 22–32)
Calcium: 9.4 mg/dL (ref 8.9–10.3)
Chloride: 106 mmol/L (ref 98–111)
Creatinine, Ser: 0.78 mg/dL (ref 0.44–1.00)
GFR, Estimated: >60 mL/min (ref >60)
Glucose, Bld: 127 mg/dL — ABNORMAL HIGH (ref 70–99)
Potassium: 3.6 mmol/L (ref 3.5–5.1)
Sodium: 136 mmol/L (ref 135–145)
Total Bilirubin: 0.5 mg/dL (ref 0.3–1.2)
Total Protein: 7.4 g/dL (ref 6.5–8.1)

## 2023-04-17 LAB — LIPID PANEL
Cholesterol: 134 mg/dL (ref 0–200)
HDL: 68 mg/dL (ref >40)
LDL Cholesterol: 56 mg/dL (ref 0–99)
Total CHOL/HDL Ratio: 2 RATIO
Triglycerides: 50 mg/dL (ref <150)
VLDL: 10 mg/dL (ref 0–40)

## 2023-04-17 LAB — TSH: TSH: 1.114 u[IU]/mL (ref 0.350–4.500)

## 2023-04-17 LAB — POCT PREGNANCY, URINE: Preg Test, Ur: NEGATIVE

## 2023-04-17 LAB — HEMOGLOBIN A1C
Hgb A1c MFr Bld: 5.9 % — ABNORMAL HIGH (ref 4.8–5.6)
Mean Plasma Glucose: 123 mg/dL

## 2023-04-17 LAB — ETHANOL: Alcohol, Ethyl (B): <10 mg/dL (ref <10)

## 2023-04-17 MED ORDER — OLANZAPINE 5 MG PO TABS: 5.0000 mg | ORAL_TABLET | Freq: Every day | ORAL | Status: DC

## 2023-04-17 MED ORDER — OLANZAPINE 5 MG PO TABS
5.0000 mg | ORAL_TABLET | Freq: Every day | ORAL | Status: DC
  Administered 2023-04-17: 5 mg via ORAL
  Filled 2023-04-17 (×4): qty 1

## 2023-04-17 MED ORDER — DIPHENHYDRAMINE HCL 50 MG/ML IJ SOLN: 50.0000 mg | Freq: Three times a day (TID) | INTRAMUSCULAR | Status: DC | PRN

## 2023-04-17 MED ORDER — ALUM & MAG HYDROXIDE-SIMETH 200-200-20 MG/5ML PO SUSP: 30.0000 mL | ORAL | Status: DC | PRN

## 2023-04-17 MED ORDER — LORAZEPAM 1 MG PO TABS: 2.0000 mg | ORAL_TABLET | Freq: Three times a day (TID) | ORAL | Status: DC | PRN

## 2023-04-17 MED ORDER — HALOPERIDOL LACTATE 5 MG/ML IJ SOLN: 5.0000 mg | Freq: Three times a day (TID) | INTRAMUSCULAR | Status: DC | PRN

## 2023-04-17 MED ORDER — TRAZODONE HCL 50 MG PO TABS
50.0000 mg | ORAL_TABLET | Freq: Every evening | ORAL | Status: DC | PRN
  Administered 2023-04-21: 50 mg via ORAL
  Filled 2023-04-17 (×2): qty 1

## 2023-04-17 MED ORDER — ACETAMINOPHEN 325 MG PO TABS: 650.0000 mg | ORAL_TABLET | Freq: Four times a day (QID) | ORAL | Status: DC | PRN

## 2023-04-17 MED ORDER — HALOPERIDOL 5 MG PO TABS: 5.0000 mg | ORAL_TABLET | Freq: Three times a day (TID) | ORAL | Status: DC | PRN

## 2023-04-17 MED ORDER — HYDROXYZINE HCL 25 MG PO TABS: 25.0000 mg | ORAL_TABLET | Freq: Three times a day (TID) | ORAL | Status: DC | PRN

## 2023-04-17 MED ORDER — MAGNESIUM HYDROXIDE 400 MG/5ML PO SUSP: 30.0000 mL | Freq: Every day | ORAL | Status: DC | PRN

## 2023-04-17 MED ORDER — LORAZEPAM 2 MG/ML IJ SOLN: 2.0000 mg | Freq: Three times a day (TID) | INTRAMUSCULAR | Status: DC | PRN

## 2023-04-17 MED ORDER — TRAZODONE HCL 50 MG PO TABS: 50.0000 mg | ORAL_TABLET | Freq: Every evening | ORAL | Status: DC | PRN

## 2023-04-17 MED ORDER — DIPHENHYDRAMINE HCL 25 MG PO CAPS: 50.0000 mg | ORAL_CAPSULE | Freq: Three times a day (TID) | ORAL | Status: DC | PRN

## 2023-04-17 NOTE — Progress Notes (Addendum)
Admission Note:   Patient is a 27 yr female who presents Voluntary in no acute distress for the treatment of Schizophrenia. Pt appears flat and confused. Pt was calm, cooperative with noticeable confusion with admission process.Pt contracts for safety upon admission. Pt states that she hears echoes and see shadows and lights.    Patient was very confused at the time of admission. She admitted to using mushrooms and drinking wine daily; patient stated that she only believes in natural herbal medication. Patient labs is + THC.   Skin was assessed and found to be clear of any abnormal marks bi-lateral tattoos on arms and chest area. PT searched and no contraband found, POC and unit policies explained and understanding verbalized. Consents obtained. Food and fluids offered, and fluids accepted. Pt had no additional questions or concerns.

## 2023-04-17 NOTE — Tx Team (Signed)
Initial Treatment Plan 04/17/2023 6:38 PM Tammie Parker [id number]    PATIENT STRESSORS: Substance abuse     PATIENT STRENGTHS: Communication skills  Physical Health    PATIENT IDENTIFIED PROBLEMS: Substance abuse                     DISCHARGE CRITERIA:  Adequate post-discharge living arrangements  PRELIMINARY DISCHARGE PLAN: Return to previous living arrangement  PATIENT/FAMILY INVOLVEMENT: This treatment plan has been presented to and reviewed with the patient, Tammie Parker, and/or family member,   The patient and family have been given the opportunity to ask questions and make suggestions.  Tammie Spanish, RN 04/17/2023, 6:38 PM

## 2023-04-17 NOTE — Progress Notes (Signed)
   04/17/23 0843  [facility] Triage Screening (Walk-ins at [facility] only)  How Did You Hear About [place]? Family/Friend  What Is the Reason for Your Visit/Call Today? Pt presents to [facility] voluntarily accompanied by her mother. Pt reports she has a History of schizophrenia, PTSD, autism, and Bipolar disorder. Pt reports lack of sleep for the past couple of weeks. Pt reports using shrooms, marijuana and wine, last use of shrooms was 2 weeks ago, last use of marijuana is unknown, and last use of wine was lastnight 1 glass. Pt reports daily use of wine, usually a whole bottle.  Pt reports she threw away her medication approximately 1 week ago.Pt denies SI/HI and AVH currently.  How Long Has This Been Causing You Problems? <Week  Have You Recently Had Any Thoughts About Hurting Yourself? No  Are You Planning to Commit Suicide/Harm Yourself At This time? No  Have you Recently Had Thoughts About Hurting Someone [name]? No  Are You Planning To Harm Someone At This Time? No  Are you currently experiencing any auditory, visual or other hallucinations? No  Have You Used Any Alcohol or Drugs in the Past 24 Hours? Yes  How long ago did you use Drugs or Alcohol? yesterday  What Did You Use and How Much? 1 glass of wine  Do you have any current medical co-morbidities that require immediate attention? No  Clinician description of patient physical appearance/behavior: thought blocking, disoriented, tank top and biker shorts, guarded  What Do You Feel Would Help You the Most Today? Treatment for Depression or other mood problem  If access to [facility]gent [facility] was not available, would you have sought care in the Emergency Department? No  Determination of Need Urgent (48 hours)  Options For Referral Inpatient Hospitalization

## 2023-04-17 NOTE — ED Notes (Signed)
Patient was admitted to the observation unit. Patient denies SI/HI and AVH. Patient informed writer she was self-medicating with wine and mushrooms. Patient was teary when discussing her alcohol use and how she has been justifying her drinking. Patient is looking forward to being discharged to [facility]. Patient is being monitored for safety.

## 2023-04-17 NOTE — BHH Group Notes (Signed)
Adult Psychoeducational Group Note  Date:  04/17/2023 Time:  9:43 PM  Group Topic/Focus:  Wrap-Up Group:   The focus of this group is to help patients review their daily goal of treatment and discuss progress on daily workbooks.  Participation Level:  Did Not Attend  Participation Quality:   Did not attend  Affect:   Pt did not attend  Cognitive:   Pt did not attend  Insight: None  Engagement in Group:   Pt did not attend  Modes of Intervention:   Pt did not attend  Additional Comments:  Pt refuse to come to Wrap up group.  Yavuz Kirby, Sharen Counter 04/17/2023, 9:43 PM
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
## 2023-04-17 NOTE — ED Notes (Signed)
Patient was discharged to [facility]. Patient was escorted and transported to [facility] by [employer].

## 2023-04-17 NOTE — Discharge Instructions (Addendum)
Accepted to [facility] 

## 2023-04-17 NOTE — Progress Notes (Signed)
Pt was accepted to [facility] TODAY 04/17/2023. Bed assignment: 402-1  Pt meets inpatient criteria per Hillery Jacks, NP  Attending Physician will be Phineas Inches, MD  Report can be called to: - Adult unit: [phone]  Pt can arrive after 4 PM  Care Team Notified: [facility] [place], RN, Hillery Jacks, NP, and Regan Lemming, RN  [place], [place]  04/17/2023 12:19 PM
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
## 2023-04-18 ENCOUNTER — Encounter (HOSPITAL_COMMUNITY): Payer: Self-pay | Admitting: Psychiatry

## 2023-04-18 MED ORDER — THIAMINE HCL 100 MG/ML IJ SOLN: 100.0000 mg | Freq: Every day | INTRAMUSCULAR | Status: DC

## 2023-04-18 MED ORDER — FOLIC ACID 1 MG PO TABS
1.0000 mg | ORAL_TABLET | Freq: Every day | ORAL | Status: DC
  Administered 2023-04-18 – 2023-04-23 (×6): 1 mg via ORAL
  Filled 2023-04-18 (×10): qty 1

## 2023-04-18 MED ORDER — PNEUMOCOCCAL 20-VAL CONJ VACC 0.5 ML IM SUSY
0.5000 mL | PREFILLED_SYRINGE | INTRAMUSCULAR | Status: DC
  Filled 2023-04-18: qty 0.5

## 2023-04-18 MED ORDER — LORAZEPAM 1 MG PO TABS: 1.0000 mg | ORAL_TABLET | Freq: Four times a day (QID) | ORAL | Status: DC | PRN

## 2023-04-18 MED ORDER — FLUOXETINE HCL 20 MG PO CAPS
20.0000 mg | ORAL_CAPSULE | Freq: Every day | ORAL | Status: DC
  Administered 2023-04-19 – 2023-04-23 (×5): 20 mg via ORAL
  Filled 2023-04-18 (×8): qty 1

## 2023-04-18 MED ORDER — PRAZOSIN HCL 1 MG PO CAPS
1.0000 mg | ORAL_CAPSULE | Freq: Every day | ORAL | Status: DC
  Administered 2023-04-18 – 2023-04-22 (×5): 1 mg via ORAL
  Filled 2023-04-18 (×10): qty 1

## 2023-04-18 MED ORDER — VITAMIN B-1 100 MG PO TABS
100.0000 mg | ORAL_TABLET | Freq: Every day | ORAL | Status: DC
  Administered 2023-04-18 – 2023-04-23 (×6): 100 mg via ORAL
  Filled 2023-04-18 (×10): qty 1

## 2023-04-18 MED ORDER — ADULT MULTIVITAMIN W/MINERALS CH
1.0000 | ORAL_TABLET | Freq: Every day | ORAL | Status: DC
  Administered 2023-04-18 – 2023-04-23 (×6): 1 via ORAL
  Filled 2023-04-18 (×10): qty 1

## 2023-04-18 MED ORDER — WHITE PETROLATUM EX OINT
TOPICAL_OINTMENT | CUTANEOUS | Status: AC
  Filled 2023-04-18: qty 5

## 2023-04-18 MED ORDER — OLANZAPINE 10 MG PO TABS
10.0000 mg | ORAL_TABLET | Freq: Every day | ORAL | Status: DC
  Administered 2023-04-18 – 2023-04-22 (×5): 10 mg via ORAL
  Filled 2023-04-18 (×9): qty 1

## 2023-04-18 NOTE — Group Note (Signed)
Date:  04/18/2023 Time:  9:15 PM  Group Topic/Focus:  Wrap-Up Group:   The focus of this group is to help patients review their daily goal of treatment and discuss progress on daily workbooks.    Participation Level:  Did not attend  Participation Quality:   n/a  Affect:   n/a  Cognitive:   n/a  Insight: None  Engagement in Group:   did not attend  Modes of Intervention:   n/a  Additional Comments:  Pt did not attend group, when asked how her day was, she said 6 out of 10. Happy that she got more clarity on her situation and planned for a more productive day tomorrow.  Tammie Parker E Pharell Rolfson 04/18/2023, 9:15 PM
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
## 2023-04-18 NOTE — Progress Notes (Signed)
Adult Psychoeducational Group Note  Date:  04/18/2023 Time:  1:14 PM  Group Topic/Focus:  Goals Group:   The focus of this group is to help patients establish daily goals to achieve during treatment and discuss how the patient can incorporate goal setting into their daily lives to aide in recovery. Orientation:   The focus of this group is to educate the patient on the purpose and policies of crisis stabilization and provide a format to answer questions about their admission.  The group details unit policies and expectations of patients while admitted.  Participation Level:  Did Not Attend  Participation Quality:    Affect:    Cognitive:    Insight:   Engagement in Group:    Modes of Intervention:    Additional Comments:  Pt did not attend group today  Rowena Moilanen 04/18/2023, 1:14 PM

## 2023-04-18 NOTE — Progress Notes (Signed)
Patient appears depressed. Patient denies SI/HI/AVH. Pt reports anxiety is 1/10 and depression is 1/10. Pt reports good sleep and good appetite. Patient complied with morning medication with no reported side effects. Pt was guarded. Patient remains safe on Q6min checks and contracts for safety.      04/18/23 1040  Psych Admission Type (Psych Patients Only)  Admission Status Voluntary  Psychosocial Assessment  Patient Complaints Anxiety  Eye Contact Brief  Facial Expression Flat  Affect Flat;Depressed  Speech Logical/coherent  Interaction Assertive;Minimal  Motor Activity Slow  Appearance/Hygiene Unremarkable  Behavior Characteristics Cooperative;Appropriate to situation  Mood Anxious;Sad  Thought Process  Coherency WDL  Content WDL  Delusions None reported or observed  Perception WDL  Hallucination None reported or observed  Judgment Impaired  Confusion None  Danger to Self  Current suicidal ideation? Denies  Agreement Not to Harm Self Yes  Description of Agreement verbal  Danger to Others  Danger to Others None reported or observed

## 2023-04-18 NOTE — Group Note (Signed)
Recreation Therapy Group Note   Group Topic:Animal Assisted Therapy   Group Date: 04/18/2023 Start Time: 0950 End Time: 1030 Facilitators: Azalynn Maxim-McCall, LRT,CTRS Location: [address] Dayroom   Animal-Assisted Activity (AAA) Program Checklist/Progress Notes Patient Eligibility Criteria Checklist & Daily Group note for Rec Tx Intervention  AAA/T Program Assumption of Risk Form signed by Patient/ or Parent Legal Guardian Yes  Patient is free of allergies or severe asthma Yes  Patient reports no fear of animals Yes  Patient reports no history of cruelty to animals Yes  Patient understands his/her participation is voluntary Yes  Patient washes hands before animal contact Yes  Patient washes hands after animal contact Yes   Affect/Mood: Appropriate   Participation Level: Minimal   Participation Quality: Independent   Behavior: Attentive    Speech/Thought Process: Focused    Clinical Observations/Individualized Feedback: Pt was quiet but attentive. Pt had some interaction with peers and the dog. Pt was appropriate during group session.   Plan: Continue to engage patient in RT group sessions 2-3x/week.   Tammie Parker, LRT,CTRS 04/18/2023 1:07 PM

## 2023-04-18 NOTE — [facility] Suicide Risk Assessment (Signed)
[facility] INPATIENT:  Family/Significant Other Suicide Prevention Education  Suicide Prevention Education:  Patient Refusal for Family/Significant Other Suicide Prevention Education: The patient Tammie Parker has refused to provide written consent for family/significant other to be provided Family/Significant Other Suicide Prevention Education during admission and/or prior to discharge.  Physician notified.  Isabella Bowens 04/18/2023, 11:05 AM

## 2023-04-18 NOTE — Plan of Care (Signed)
  Problem: Education: Goal: Knowledge of [facility] Education information/materials will improve Outcome: Progressing Goal: Emotional status will improve Outcome: Progressing Goal: Mental status will improve Outcome: Progressing Goal: Verbalization of understanding the information provided will improve Outcome: Progressing   Problem: Activity: Goal: Interest or engagement in activities will improve Outcome: Progressing Goal: Sleeping patterns will improve Outcome: Progressing   Problem: Coping: Goal: Ability to verbalize frustrations and anger appropriately will improve Outcome: Progressing Goal: Ability to demonstrate self-control will improve Outcome: Progressing   Problem: Health Behavior/Discharge Planning: Goal: Identification of resources available to assist in meeting health care needs will improve Outcome: Progressing Goal: Compliance with treatment plan for underlying cause of condition will improve Outcome: Progressing   Problem: Physical Regulation: Goal: Ability to maintain clinical measurements within normal limits will improve Outcome: Progressing   Problem: Safety: Goal: Periods of time without injury will increase Outcome: Progressing   Problem: Education: Goal: Knowledge of disease or condition will improve Outcome: Progressing Goal: Understanding of discharge needs will improve Outcome: Progressing   Problem: Health Behavior/Discharge Planning: Goal: Ability to identify changes in lifestyle to reduce recurrence of condition will improve Outcome: Progressing Goal: Identification of resources available to assist in meeting health care needs will improve Outcome: Progressing   Problem: Physical Regulation: Goal: Complications related to the disease process, condition or treatment will be avoided or minimized Outcome: Progressing   Problem: Safety: Goal: Ability to remain free from injury will improve Outcome: Progressing   Problem:  Activity: Goal: Will verbalize the importance of balancing activity with adequate rest periods Outcome: Progressing   Problem: Education: Goal: Will be free of psychotic symptoms Outcome: Progressing Goal: Knowledge of the prescribed therapeutic regimen will improve Outcome: Progressing   Problem: Coping: Goal: Coping ability will improve Outcome: Progressing Goal: Will verbalize feelings Outcome: Progressing   Problem: Health Behavior/Discharge Planning: Goal: Compliance with prescribed medication regimen will improve Outcome: Progressing   Problem: Nutritional: Goal: Ability to achieve adequate nutritional intake will improve Outcome: Progressing   Problem: Role Relationship: Goal: Ability to communicate needs accurately will improve Outcome: Progressing Goal: Ability to interact with others will improve Outcome: Progressing   Problem: Safety: Goal: Ability to redirect hostility and anger into socially appropriate behaviors will improve Outcome: Progressing Goal: Ability to remain free from injury will improve Outcome: Progressing   Problem: Self-Care: Goal: Ability to participate in self-care as condition permits will improve Outcome: Progressing   Problem: Self-Concept: Goal: Will verbalize positive feelings about self Outcome: Progressing   
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
## 2023-04-18 NOTE — Progress Notes (Signed)
   04/18/23 0558  15 Minute Checks  Location Bedroom  Visual Appearance Calm  Behavior Sleeping  Sleep (Behavioral Health Patients Only)  Calculate sleep? (Click Yes once per 24 hr at 0600 safety check) Yes  Documented sleep last 24 hours 8.5

## 2023-04-18 NOTE — Progress Notes (Addendum)
   04/17/23 2135  Psych Admission Type (Psych Patients Only)  Admission Status Voluntary  Psychosocial Assessment  Patient Complaints Anxiety;Sleep disturbance  Eye Contact Brief  Facial Expression Flat;Sullen  Affect Flat  Speech Logical/coherent  Interaction Assertive;Minimal  Motor Activity Slow  Appearance/Hygiene Unremarkable  Behavior Characteristics Cooperative;Appropriate to situation  Mood Anxious;Sad  Thought Process  Coherency WDL  Content WDL  Delusions None reported or observed  Perception WDL  Hallucination None reported or observed  Judgment Impaired  Confusion None  Danger to Self  Current suicidal ideation? Denies  Agreement Not to Harm Self Yes  Description of Agreement verbal  Danger to Others  Danger to Others None reported or observed   Pt reports 3/10 anxiety and 0/10 depression. Pt was offered support and encouragement. Pt was given scheduled medications. Pt was encouraged to attend group. Q 15 minute checks were done for safety.  Pt did not attend group, remained in room sleeping. Minimally interactive with peers and staff. Pt has no complaints.Pt receptive to treatment and safety maintained on unit.

## 2023-04-18 NOTE — Group Note (Signed)
Date:  04/18/2023 Time:  12:19 PM  Group Topic/Focus:  Managing Feelings:   The focus of this group is to identify what feelings patients have difficulty handling and develop a plan to handle them in a healthier way upon discharge.    Participation Level:  Active  Participation Quality:  Appropriate  Affect:  Appropriate  Cognitive:  Appropriate  Insight: Appropriate  Engagement in Group:  Engaged  Modes of Intervention:  Discussion, Rapport Building, Socialization, and Support  Additional Comments:    [name] 04/18/2023, 12:19 PM

## 2023-04-18 NOTE — H&P (Addendum)
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 7/2/202412:11 PM

## 2023-04-19 ENCOUNTER — Encounter (HOSPITAL_COMMUNITY): Payer: Self-pay

## 2023-04-19 MED ORDER — WHITE PETROLATUM EX OINT
TOPICAL_OINTMENT | CUTANEOUS | Status: AC
  Administered 2023-04-19: 1
  Filled 2023-04-19: qty 5

## 2023-04-19 NOTE — Progress Notes (Signed)
   04/19/23 0800  Psych Admission Type (Psych Patients Only)  Admission Status Voluntary  Psychosocial Assessment  Patient Complaints Anxiety  Eye Contact Brief  Facial Expression Flat  Affect Flat  Speech Logical/coherent  Interaction Assertive;Minimal  Motor Activity Slow  Appearance/Hygiene Unremarkable  Behavior Characteristics Cooperative;Calm;Guarded  Mood Depressed;Anxious  Thought Process  Coherency WDL  Content WDL  Delusions None reported or observed  Perception WDL  Hallucination None reported or observed (Pt denies.)  Judgment Impaired  Confusion None  Danger to Self  Current suicidal ideation? Denies  Agreement Not to Harm Self Yes  Description of Agreement Verbal  Danger to Others  Danger to Others None reported or observed
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
## 2023-04-19 NOTE — BH IP Treatment Plan (Signed)
Interdisciplinary Treatment and Diagnostic Plan Update  04/19/2023 Time of Session: 10:35am Tammie Parker MRN: [id number]  Principal Diagnosis: Schizoaffective disorder, bipolar type (HCC)  Secondary Diagnoses: Principal Problem:   Schizoaffective disorder, bipolar type (HCC)   Current Medications:  Current Facility-Administered Medications  Medication Dose Route Frequency Provider Last Rate Last Admin   acetaminophen (TYLENOL) tablet 650 mg  650 mg Oral Q6H PRN Oneta Rack, NP       alum & mag hydroxide-simeth (MAALOX/MYLANTA) 200-200-20 MG/5ML suspension 30 mL  30 mL Oral Q4H PRN Oneta Rack, NP       diphenhydrAMINE (BENADRYL) capsule 50 mg  50 mg Oral TID PRN Oneta Rack, NP       Or   diphenhydrAMINE (BENADRYL) injection 50 mg  50 mg Intramuscular TID PRN Oneta Rack, NP       FLUoxetine (PROZAC) capsule 20 mg  20 mg Oral Daily Ntuen, Jesusita Oka, FNP   20 mg at 04/19/23 1308   folic acid (FOLVITE) tablet 1 mg  1 mg Oral Daily Ntuen, Jesusita Oka, FNP   1 mg at 04/19/23 0758   haloperidol (HALDOL) tablet 5 mg  5 mg Oral TID PRN Oneta Rack, NP       Or   haloperidol lactate (HALDOL) injection 5 mg  5 mg Intramuscular TID PRN Oneta Rack, NP       hydrOXYzine (ATARAX) tablet 25 mg  25 mg Oral TID PRN Oneta Rack, NP       LORazepam (ATIVAN) tablet 2 mg  2 mg Oral TID PRN Oneta Rack, NP       Or   LORazepam (ATIVAN) injection 2 mg  2 mg Intramuscular TID PRN Oneta Rack, NP       LORazepam (ATIVAN) tablet 1 mg  1 mg Oral Q6H PRN Ntuen, Jesusita Oka, FNP       magnesium hydroxide (MILK OF MAGNESIA) suspension 30 mL  30 mL Oral Daily PRN Oneta Rack, NP       multivitamin with minerals tablet 1 tablet  1 tablet Oral Daily Ntuen, Jesusita Oka, FNP   1 tablet at 04/19/23 0758   OLANZapine (ZYPREXA) tablet 10 mg  10 mg Oral QHS Cecilie Lowers, FNP   10 mg at 04/18/23 2136   pneumococcal 20-valent conjugate vaccine (PREVNAR 20) injection 0.5 mL  0.5 mL Intramuscular  Tomorrow-1000 [place], Harrold Donath, MD       prazosin (MINIPRESS) capsule 1 mg  1 mg Oral QHS Ntuen, Jesusita Oka, FNP   1 mg at 04/18/23 2137   thiamine (Vitamin B-1) tablet 100 mg  100 mg Oral Daily Ntuen, Jesusita Oka, FNP   100 mg at 04/19/23 6578   Or   thiamine (VITAMIN B1) injection 100 mg  100 mg Intravenous Daily Ntuen, Jesusita Oka, FNP       traZODone (DESYREL) tablet 50 mg  50 mg Oral QHS PRN Oneta Rack, NP       PTA Medications: Medications Prior to Admission  Medication Sig Dispense Refill Last Dose   naproxen sodium (ALEVE) 220 MG tablet Take 220 mg by mouth 2 (two) times daily as needed (For headache).      OLANZapine (ZYPREXA) 10 MG tablet Take 1 tablet (10 mg total) by mouth at bedtime. (Patient not taking: Reported on 04/17/2023) 30 tablet 3     Patient Stressors: Substance abuse    Patient Strengths: Manufacturing systems engineer  Physical Health   Treatment Modalities:  Medication Management, Group therapy, Case management,  1 to 1 session with clinician, Psychoeducation, Recreational therapy.   Physician Treatment Plan for Primary Diagnosis: Schizoaffective disorder, bipolar type (HCC) Long Term Goal(s): Improvement in symptoms so as ready for discharge   Short Term Goals: Ability to identify changes in lifestyle to reduce recurrence of condition will improve Ability to verbalize feelings will improve Ability to disclose and discuss suicidal ideas Ability to demonstrate self-control will improve Ability to identify and develop effective coping behaviors will improve Ability to maintain clinical measurements within normal limits will improve Compliance with prescribed medications will improve Ability to identify triggers associated with substance abuse/mental health issues will improve  Medication Management: Evaluate patient's response, side effects, and tolerance of medication regimen.  Therapeutic Interventions: 1 to 1 sessions, Unit Group sessions and Medication  administration.  Evaluation of Outcomes: Progressing  Physician Treatment Plan for Secondary Diagnosis: Principal Problem:   Schizoaffective disorder, bipolar type (HCC)  Long Term Goal(s): Improvement in symptoms so as ready for discharge   Short Term Goals: Ability to identify changes in lifestyle to reduce recurrence of condition will improve Ability to verbalize feelings will improve Ability to disclose and discuss suicidal ideas Ability to demonstrate self-control will improve Ability to identify and develop effective coping behaviors will improve Ability to maintain clinical measurements within normal limits will improve Compliance with prescribed medications will improve Ability to identify triggers associated with substance abuse/mental health issues will improve     Medication Management: Evaluate patient's response, side effects, and tolerance of medication regimen.  Therapeutic Interventions: 1 to 1 sessions, Unit Group sessions and Medication administration.  Evaluation of Outcomes: Progressing   RN Treatment Plan for Primary Diagnosis: Schizoaffective disorder, bipolar type (HCC) Long Term Goal(s): Knowledge of disease and therapeutic regimen to maintain health will improve  Short Term Goals: Ability to remain free from injury will improve, Ability to verbalize frustration and anger appropriately will improve, Ability to demonstrate self-control, Ability to participate in decision making will improve, Ability to verbalize feelings will improve, Ability to disclose and discuss suicidal ideas, Ability to identify and develop effective coping behaviors will improve, and Compliance with prescribed medications will improve  Medication Management: RN will administer medications as ordered by provider, will assess and evaluate patient's response and provide education to patient for prescribed medication. RN will report any adverse and/or side effects to prescribing  provider.  Therapeutic Interventions: 1 on 1 counseling sessions, Psychoeducation, Medication administration, Evaluate responses to treatment, Monitor vital signs and CBGs as ordered, Perform/monitor CIWA, COWS, AIMS and Fall Risk screenings as ordered, Perform wound care treatments as ordered.  Evaluation of Outcomes: Progressing   LCSW Treatment Plan for Primary Diagnosis: Schizoaffective disorder, bipolar type (HCC) Long Term Goal(s): Safe transition to appropriate next level of care at discharge, Engage patient in therapeutic group addressing interpersonal concerns.  Short Term Goals: Engage patient in aftercare planning with referrals and resources, Increase social support, Increase ability to appropriately verbalize feelings, Increase emotional regulation, Facilitate acceptance of mental health diagnosis and concerns, Facilitate patient progression through stages of change regarding substance use diagnoses and concerns, Identify triggers associated with mental health/substance abuse issues, and Increase skills for wellness and recovery  Therapeutic Interventions: Assess for all discharge needs, 1 to 1 time with Social worker, Explore available resources and support systems, Assess for adequacy in community support network, Educate family and significant other(s) on suicide prevention, Complete Psychosocial Assessment, Interpersonal group therapy.  Evaluation of Outcomes: Progressing   Progress in  Treatment: Attending groups: Yes. Participating in groups: Yes. Taking medication as prescribed: Yes. Toleration medication: Yes. Family/Significant other contact made: No, will contact:  CSW will assess and identify social support  Patient understands diagnosis: Yes. Discussing patient identified problems/goals with staff: Yes. Medical problems stabilized or resolved: Yes. Denies suicidal/homicidal ideation: Yes. Issues/concerns per patient self-inventory: Yes.   New problem(s)  identified: No, Describe:  none reported   New Short Term/Long Term Goal(s):   medication stabilization, elimination of SI thoughts, development of comprehensive mental wellness plan.    Patient Goals:  Pt states, "I want to be more productive with my thoughts."  Discharge Plan or Barriers:  Patient recently admitted. CSW will continue to follow and assess for appropriate referrals and possible discharge planning.    Reason for Continuation of Hospitalization: Anxiety Depression Medication stabilization Other; describe psychosis   Estimated Length of Stay: 3-5 days  Last 3 [place] Suicide Severity Risk Score: Flowsheet Row [facility] (Current[facility] Most recent reading at 04/17/2023  5:00 PM ED from 04/17/2023 in [facility] Most recent reading at 04/17/2023  8:59 AM ED from 04/18/2022 in [facility] Most recent reading at 04/18/2022  7:40 AM  C-SSRS RISK CATEGORY No Risk No Risk No Risk       Last PHQ 2/9 Scores:    09/19/2022    1:03 PM  Depression screen PHQ 2/9  Decreased Interest 0  Down, Depressed, Hopeless 1  PHQ - 2 Score 1  Altered sleeping 0  Tired, decreased energy 1  Change in appetite 1  Feeling bad or failure about yourself  2  Trouble concentrating 2  Moving slowly or fidgety/restless 2  Suicidal thoughts 0  PHQ-9 Score 9  Difficult doing work/chores Not difficult at all    Scribe for Treatment Team: Beatris Si, LCSW 04/19/2023 2:56 PM

## 2023-04-19 NOTE — Group Note (Signed)
Date:  04/19/2023 Time:  3:24 PM  Group Topic/Focus:  Orientation:   The focus of this group is to educate the patient on the purpose and policies of crisis stabilization and provide a format to answer questions about their admission.  The group details unit policies and expectations of patients while admitted.    Participation Level:  Did Not Attend  Participation Quality:      Affect:      Cognitive:      Insight: None  Engagement in Group:     Modes of Intervention:      Additional Comments:      Reymundo Poll 04/19/2023, 3:24 PM

## 2023-04-19 NOTE — [facility] Group Notes (Signed)
Spiritual care group facilitated by Chaplain Katy Denessa Cavan, [facility]  Group focused on topic of strength. Group members reflected on what thoughts and feelings emerge when they hear this topic. They then engaged in facilitated dialog around how strength is present in their lives. This dialog focused on representing what strength had been to them in their lives (images and patterns given) and what they saw as helpful in their life now (what they needed / wanted).  Activity drew on narrative framework.  Patient Progress: Did not attend.  

## 2023-04-19 NOTE — Group Note (Signed)
Recreation Therapy Group Note   Group Topic:Problem Solving  Group Date: 04/19/2023 Start Time: 1005 End Time: 1035 Facilitators: Cyriah Childrey-McCall, LRT,CTRS Location: 400 [address] Dayroom   Goal Area(s) Addresses:  Patient will effectively work in a team with other group members. Patient will verbalize importance of using appropriate problem solving techniques.  Patient will identify positive change associated with effective problem solving skills.   Group Description: Brain Teasers.  Patients were given two sheets of brain teasers. Patients were to complete the teasers to the best of their ability. Once completed, LRT and patients reviewed the answers. Patients would add all the questions they got correct, the person with the most correct answers got a prize. If there was a tie, LRT would ask a tie breaker question.    Affect/Mood: N/A   Participation Level: Did not attend    Clinical Observations/Individualized Feedback:     Plan: Continue to engage patient in RT group sessions 2-3x/week.   Xavius Spadafore-McCall, LRT,CTRS 04/19/2023 1:43 PM

## 2023-04-19 NOTE — Group Note (Signed)
Date:  04/19/2023 Time:  4:48 PM  Group Topic/Focus:  Wellness Toolbox:   The focus of this group is to discuss various aspects of wellness, balancing those aspects and exploring ways to increase the ability to experience wellness.  Patients will create a wellness toolbox for use upon discharge.    Participation Level:  Active  Participation Quality:  Appropriate  Affect:  Appropriate  Cognitive:  Appropriate  Insight: Appropriate  Engagement in Group:  Engaged  Modes of Intervention:  Education  Additional Comments:     Reymundo Poll 04/19/2023, 4:48 PM

## 2023-04-19 NOTE — Progress Notes (Signed)
   04/19/23 0600  15 Minute Checks  Location Dayroom  Visual Appearance Calm  Behavior Composed  Sleep (Behavioral Health Patients Only)  Calculate sleep? (Click Yes once per 24 hr at 0600 safety check) Yes  Documented sleep last 24 hours 8.75

## 2023-04-19 NOTE — Progress Notes (Signed)
   04/18/23 2136  Psych Admission Type (Psych Patients Only)  Admission Status Voluntary  Psychosocial Assessment  Patient Complaints Anxiety  Eye Contact Brief  Facial Expression Flat  Affect Flat  Speech Logical/coherent  Interaction Assertive;Minimal  Motor Activity Slow  Appearance/Hygiene Unremarkable  Behavior Characteristics Cooperative;Guarded  Mood Anxious;Depressed  Thought Process  Coherency WDL  Content WDL  Delusions None reported or observed  Perception WDL  Hallucination None reported or observed  Judgment Impaired  Confusion None  Danger to Self  Current suicidal ideation? Denies  Agreement Not to Harm Self Yes  Description of Agreement verbal  Danger to Others  Danger to Others None reported or observed   Pt was offered support and encouragement. Pt was given scheduled medications. Q 15 minute checks were done for safety. Pt did not attend group. Minimally interactive with peers and staff. Pt has no complaints.Pt receptive to treatment and safety maintained on unit.

## 2023-04-20 NOTE — Progress Notes (Signed)
Pt on unit, attending group and compliant with medications. Pt denies SI/HI/AVH, endorses improvement in symptoms. Pt had not eaten as of assessment, food tray offered. Pt denies withdrawal symptoms at this time. Q 15 minute checks ongoing.

## 2023-04-20 NOTE — Group Note (Signed)
Date:  04/20/2023 Time:  12:20 PM  Group Topic/Focus:     SOCIAL WORK GROUP                                                   [facility] LCSW Group Therapy Note    Group Date: @GROUPDATE @ Start Time: @GROUPSTARTTIME @ End Time: @GROUPENDTIME @  Type of Therapy and Topic:  Group Therapy:  Overcoming Obstacles  Participation Level:    Mood:  Description of Group:   In this group patients will be encouraged to explore what they see as obstacles to their own wellness and recovery. They will be guided to discuss their thoughts, feelings, and behaviors related to these obstacles. The group will process together ways to cope with barriers, with attention given to specific choices patients can make. Each patient will be challenged to identify changes they are motivated to make in order to overcome their obstacles. This group will be process-oriented, with patients participating in exploration of their own experiences as well as giving and receiving support and challenge from other group members.  Therapeutic Goals: 1. Patient will identify personal and current obstacles as they relate to admission. 2. Patient will identify barriers that currently interfere with their wellness or overcoming obstacles.  3. Patient will identify feelings, thought process and behaviors related to these barriers. 4. Patient will identify two changes they are willing to make to overcome these obstacles:    Summary of Patient Progress     Therapeutic Modalities:   Cognitive Behavioral Therapy Solution Focused Therapy Motivational Interviewing Relapse Prevention Therapy   [name]   Participation Level:  Active  Participation Quality:  Appropriate  Affect:  Appropriate  Cognitive:  Appropriate  Insight: Appropriate  Engagement in Group:  Engaged  Modes of Intervention:  Discussion and Exploration  Additional Comments:    [name] 04/20/2023, 12:20 PM

## 2023-04-20 NOTE — Progress Notes (Signed)
   04/20/23 0000  Psychosocial Assessment  Patient Complaints None  Eye Contact Fair  Facial Expression Sad  Affect Depressed  Speech Logical/coherent  Interaction Assertive  Motor Activity  (WDL)  Appearance/Hygiene Poor hygiene  Behavior Characteristics Cooperative  Mood Depressed  Thought Process  Coherency WDL  Content WDL  Delusions None reported or observed  Perception WDL  Hallucination None reported or observed  Judgment UTA  Confusion None  Danger to Self  Current suicidal ideation? Denies  Agreement Not to Harm Self Yes  Description of Agreement Verbal  Danger to Others  Danger to Others None reported or observed   D: Pt alert and oriented. Pt rates depression 0/10 and anxiety 0/10.  Pt denies experiencing any SI/HI, or AVH at this time.   A: Scheduled medications administered to pt, per MD orders. Support and encouragement provided. Frequent verbal contact made. Routine safety checks conducted q15 minutes.   R: No adverse drug reactions noted. Pt verbally contracts for safety at this time. Pt complaint with medications and treatment plan. Pt isolates herself in the room and did not   participate in the    group. Pt remains safe at this time. Will continue to monitor.

## 2023-04-20 NOTE — [facility] Group Notes (Signed)
[facility] Group Notes:  (Nursing/MHT/Case Management/Adjunct)  Date:  04/20/2023  Time:  9:21 PM  Type of Therapy:   Wrap-up group  Participation Level:  Active  Participation Quality:  Appropriate  Affect:  Appropriate  Cognitive:  Appropriate  Insight:  Appropriate  Engagement in Group:  Engaged  Modes of Intervention:  Education  Summary of Progress/Problems: Pt goal to work on thought process. Day 6/10.   Tammie Parker 04/20/2023, 9:21 PM
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
## 2023-04-20 NOTE — [facility] Group Notes (Signed)
[facility] Group Notes:  (Nursing/MHT/Case Management/Adjunct)  Date:  04/20/2023  Time:  4:49 AM  Type of Therapy:   NA group  Participation Level:  Did Not Attend  Participation Quality:    Affect:    Cognitive:    Insight:    Engagement in Group:    Modes of Intervention:    Summary of Progress/Problems:  Darren Nodal L Meyah Corle 04/20/2023, 4:49 AM 

## 2023-04-21 NOTE — [facility] Group Notes (Signed)
[facility] Group Notes:  (Nursing/MHT/Case Management/Adjunct)  Date:  04/21/2023  Time:  8:30 PM  Type of Therapy:   AA group  Participation Level:  Active  Participation Quality:  Appropriate  Affect:  Appropriate  Cognitive:  Appropriate  Insight:  Appropriate  Engagement in Group:  Engaged  Modes of Intervention:  Education  Summary of Progress/Problems: Attended AA meeting.  Tammie Parker 04/21/2023, 8:30 PM
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
## 2023-04-21 NOTE — Group Note (Signed)
Recreation Therapy Group Note   Group Topic:Team Building  Group Date: 04/21/2023 Start Time: 0932 End Time: 1000 Facilitators: Lovenia Debruler-McCall, LRT,CTRS Location: [address] Dayroom   Goal Area(s) Addresses:  Patient will effectively work with peer towards shared goal.  Patient will identify skills used to make activity successful.  Patient will identify how skills used during activity can be used to reach post d/c goals.   Group Description: Straw Bridge. In teams of 3-5, patients were given 15 plastic drinking straws and an equal length of masking tape. Using the materials provided, patients were instructed to build a free standing bridge-like structure to suspend an everyday item (ex: puzzle box) off of the floor or table surface. All materials were required to be used by the team in their design. LRT facilitated post-activity discussion reviewing team process. Patients were encouraged to reflect how the skills used in this activity can be generalized to daily life post discharge.    Affect/Mood: Appropriate   Participation Level: Engaged   Participation Quality: Independent   Behavior: Appropriate   Speech/Thought Process: Focused   Insight: Good   Judgement: Good   Modes of Intervention: STEM Activity   Patient Response to Interventions:  Engaged   Education Outcome:  Acknowledges education   Clinical Observations/Individualized Feedback: Pt was engaged and focused. Pt worked with peers in coming with a concept and then forming their bridge. Pt expressed it took patience to complete the activity.      Plan: Continue to engage patient in RT group sessions 2-3x/week.   Adren Dollins-McCall, LRT,CTRS 04/21/2023 12:41 PM

## 2023-04-21 NOTE — Plan of Care (Signed)
  Problem: Education: Goal: Mental status will improve Outcome: Progressing Goal: Verbalization of understanding the information provided will improve Outcome: Progressing   Problem: Activity: Goal: Interest or engagement in activities will improve Outcome: Progressing Goal: Sleeping patterns will improve Outcome: Progressing   Problem: Coping: Goal: Ability to verbalize frustrations and anger appropriately will improve Outcome: Progressing Goal: Ability to demonstrate self-control will improve Outcome: Progressing   

## 2023-04-21 NOTE — [facility] Group Notes (Signed)
Adult Psychoeducational Group Note  Date:  04/21/2023 Time:  11:17 AM  Group Topic/Focus:  Goals Group:   The focus of this group is to help patients establish daily goals to achieve during treatment and discuss how the patient can incorporate goal setting into their daily lives to aide in recovery. Orientation:   The focus of this group is to educate the patient on the purpose and policies of crisis stabilization and provide a format to answer questions about their admission.  The group details unit policies and expectations of patients while admitted.  Participation Level:  Active  Participation Quality:  Appropriate  Affect:  Appropriate  Cognitive:  Appropriate  Insight: Good  Engagement in Group:  Engaged  Modes of Intervention:  Discussion and Education  Additional Comments:  Pt actively participated in group today. Pt identified her goal is stay present and be in the moment. Pt provided her insight and understanding on the topic of self-awareness  Burnett Sheng 04/21/2023, 11:17 AM

## 2023-04-21 NOTE — Progress Notes (Signed)
   04/20/23 2033  Psych Admission Type (Psych Patients Only)  Admission Status Voluntary  Psychosocial Assessment  Patient Complaints None  Eye Contact Fair  Facial Expression Animated  Affect Anxious;Depressed  Speech Logical/coherent  Interaction Assertive  Motor Activity Other (Comment)  Appearance/Hygiene Unremarkable  Behavior Characteristics Cooperative;Appropriate to situation  Mood Depressed  Thought Process  Coherency WDL  Content WDL  Delusions None reported or observed  Perception WDL  Hallucination None reported or observed  Judgment Impaired  Confusion None  Danger to Self  Current suicidal ideation? Denies  Agreement Not to Harm Self Yes  Description of Agreement verbally contracated for safety  Danger to Others  Danger to Others None reported or observed

## 2023-04-21 NOTE — Group Note (Signed)
Date:  04/21/2023 Time:  4:10 PM  Group Topic/Focus:  Diagnosis Education:   The focus of this group is to discuss the major disorders that patients maybe diagnosed with.  Group discusses the importance of knowing what one's diagnosis is so that one can understand treatment and better advocate for oneself.    Participation Level:  Active  Participation Quality:  Appropriate  Affect:  Appropriate  Cognitive:  Alert  Insight: Appropriate  Engagement in Group:  Engaged  Modes of Intervention:  Activity  Additional Comments:  Pt listened attentively.  Harriet Masson 04/21/2023, 4:10 PM

## 2023-04-21 NOTE — Progress Notes (Signed)
Pt attending groups and is medication compliant. Pt reports feeling "numb." Pt denies feeling depressed or suicidal. Pt denies hallucinations. Q 15 minute checks ongoing.

## 2023-04-22 NOTE — Progress Notes (Signed)
Pt medication compliant and attending groups. Pt reports improvement in symptoms since admission. Pt endorses adequate sleep. Pt denies SI/HI/AVH. Q 15 minute checks ongoing.

## 2023-04-22 NOTE — [facility] Group Notes (Signed)
Adult Psychoeducational Group Note  Date:  04/22/2023 Time:  6:24 PM  Group Topic/Focus:  Support and Check In  Participation Level:  Active  Participation Quality:  Appropriate  Affect:  Appropriate  Cognitive:  Appropriate  Insight: Appropriate  Engagement in Group:  Engaged  Modes of Intervention:  Discussion  Additional Comments:  Pt discussed success in achieving goal set for the day.  Burlene Arnt 04/22/2023, 6:24 PM

## 2023-04-22 NOTE — Group Note (Signed)
Date:  04/22/2023 Time:  3:00 PM  Group Topic/Focus:  Building Self Esteem:   The Focus of this group is helping patients become aware of the effects of self-esteem on their lives, the things they and others do that enhance or undermine their self-esteem, seeing the relationship between their level of self-esteem and the choices they make and learning ways to enhance self-esteem.    Participation Level:  Active  Participation Quality:  Appropriate  Affect:  Appropriate  Cognitive:  Appropriate  Insight: Appropriate  Engagement in Group:  Engaged  Modes of Intervention:  Activity  Additional Comments:     Tammie Parker 04/22/2023, 3:00 PM

## 2023-04-22 NOTE — [facility] Group Notes (Signed)
Adult Psychoeducational Group Note  Date:  04/22/2023 Time:  9:00 PM  Group Topic/Focus:  Wrap-Up Group:   The focus of this group is to help patients review their daily goal of treatment and discuss progress on daily workbooks.  Participation Level:  Did Not Attend  Tammie Parker 04/22/2023, 9:00 PM

## 2023-04-22 NOTE — [facility] Group Notes (Signed)
Adult Psychoeducational Group Note  Date:  04/22/2023 Time:  11:25 AM  Group Topic/Focus:  Goals Group:   The focus of this group is to help patients establish daily goals to achieve during treatment and discuss how the patient can incorporate goal setting into their daily lives to aide in recovery. Orientation:   The focus of this group is to educate the patient on the purpose and policies of crisis stabilization and provide a format to answer questions about their admission.  The group details unit policies and expectations of patients while admitted.  Participation Level:  Active  Participation Quality:  Appropriate  Affect:  Appropriate  Cognitive:  Appropriate  Insight: Appropriate  Engagement in Group:  Engaged  Modes of Intervention:  Discussion  Additional Comments:  Pt stated that her goal is to stay neutral and keep herself composed.  Burlene Arnt 04/22/2023, 11:25 AM

## 2023-04-22 NOTE — Progress Notes (Signed)
Patient is pleasant and calm. Isolative to her room. Took scheduled medications. Denies SI/HI/AVH. Did not attend group. Denies anxiety and depression.
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
## 2023-04-22 NOTE — Plan of Care (Signed)
  Problem: Coping: Goal: Ability to verbalize frustrations and anger appropriately will improve Outcome: Progressing Goal: Ability to demonstrate self-control will improve Outcome: Progressing   Problem: Health Behavior/Discharge Planning: Goal: Identification of resources available to assist in meeting health care needs will improve Outcome: Progressing Goal: Compliance with treatment plan for underlying cause of condition will improve Outcome: Progressing   Problem: Physical Regulation: Goal: Ability to maintain clinical measurements within normal limits will improve Outcome: Progressing   Problem: Safety: Goal: Periods of time without injury will increase Outcome: Progressing   

## 2023-04-22 NOTE — Progress Notes (Signed)
   04/21/23 2045  Psych Admission Type (Psych Patients Only)  Admission Status Voluntary  Psychosocial Assessment  Patient Complaints None  Eye Contact Fair  Facial Expression Animated  Affect Anxious;Depressed  Speech Logical/coherent  Interaction Assertive  Motor Activity Other (Comment)  Appearance/Hygiene Unremarkable  Thought Process  Coherency WDL  Content WDL  Delusions None reported or observed  Perception WDL  Hallucination None reported or observed  Judgment Impaired  Confusion None  Danger to Self  Current suicidal ideation? Denies  Agreement Not to Harm Self Yes  Description of Agreement verbally contracated for safety  Danger to Others  Danger to Others None reported or observed

## 2023-04-23 DIAGNOSIS — F250 Schizoaffective disorder, bipolar type: Secondary | ICD-10-CM

## 2023-04-23 MED ORDER — TRAZODONE HCL 50 MG PO TABS: 50.0000 mg | ORAL_TABLET | Freq: Every evening | ORAL | 0 refills | Status: DC | PRN

## 2023-04-23 MED ORDER — FLUOXETINE HCL 20 MG PO CAPS: 20.0000 mg | ORAL_CAPSULE | Freq: Every day | ORAL | 3 refills | Status: DC

## 2023-04-23 MED ORDER — PRAZOSIN HCL 1 MG PO CAPS: 1.0000 mg | ORAL_CAPSULE | Freq: Every day | ORAL | 0 refills | Status: DC

## 2023-04-23 MED ORDER — HYDROXYZINE HCL 25 MG PO TABS: 25.0000 mg | ORAL_TABLET | Freq: Three times a day (TID) | ORAL | 0 refills | Status: DC | PRN

## 2023-04-23 NOTE — Plan of Care (Signed)
  Problem: Education: Goal: Knowledge of [place] [facility] Education information/materials will improve Outcome: Progressing Goal: Emotional status will improve Outcome: Progressing Goal: Mental status will improve Outcome: Progressing Goal: Verbalization of understanding the information provided will improve Outcome: Progressing   Problem: Activity: Goal: Interest or engagement in activities will improve Outcome: Progressing Goal: Sleeping patterns will improve Outcome: Progressing   Problem: Health Behavior/Discharge Planning: Goal: Identification of resources available to assist in meeting health care needs will improve 04/23/2023 1047 by Harless Litten, RN Outcome: Progressing 04/23/2023 0938 by Harless Litten, RN Outcome: Progressing Goal: Compliance with treatment plan for underlying cause of condition will improve 04/23/2023 1047 by Harless Litten, RN Outcome: Progressing 04/23/2023 0938 by Harless Litten, RN Outcome: Progressing   Problem: Coping: Goal: Ability to verbalize frustrations and anger appropriately will improve Outcome: Progressing Goal: Ability to demonstrate self-control will improve Outcome: Progressing

## 2023-04-23 NOTE — Progress Notes (Signed)
D: Patient alert and oriented, able to make needs known. Denies SI/HI, AVH at present. Denies pain at present. Patient goal today "to go home." Rates depression 0/10, hopelessness 0/10, and anxiety 0/10. Patient reports energy level as normal. She reports she slept well last night. Patient does not request any PRN medication at this time.   A: Scheduled medications administered to patient per MD order. Support and encouragement provided. Routine safety checks conducted every fifteen minutes. Patient informed to notify staff with problems or concerns. Frequent verbal contact made.   R: No adverse drug reactions noted. Patient contracts for safety at this time. Patient is compliant with medications and treatment plan. Patient receptive, calm and cooperative. Patient interacts with others appropriately on unit at present. Patient remains safe at present.
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
## 2023-04-23 NOTE — [facility] Suicide Risk Assessment (Signed)
[facility] INPATIENT:  Family/Significant Other Suicide Prevention Education  Suicide Prevention Education:  Patient Refusal for Family/Significant Other Suicide Prevention Education: The patient Tammie Parker has refused to provide written consent for family/significant other to be provided Family/Significant Other Suicide Prevention Education during admission and/or prior to discharge.  Physician notified.  Steffanie Dunn 04/23/2023, 10:31 AM

## 2023-04-23 NOTE — Progress Notes (Signed)
  [facility] Adult Case Management Discharge Plan :  Will you be returning to the same living situation after discharge:  Yes,  Patient will be returning to same location. At discharge, do you have transportation home?: Yes,  Patient reported that her mother will be providing transportation  Do you have the ability to pay for your medications: Yes,  Pt has medicaid for insurance  Release of information consent forms completed and in the chart;  Patient's signature needed at discharge.  Patient to Follow up at:  Follow-up Information     [facility]. Go on 06/26/2023.   Specialty: Behavioral Health Why: * For fastest service, please use walk in service at 7:00 am, Monday through Friday.  You have an appointment for therapy services on 06/14/23 at 2:00 pm, with Madelaine Bhat, in person.  You also have an appointment for medication management services on 06/26/23 at 11:00 am, in person.  * Please give 24 hours notice if you cannot attend the appointment, as you will be unable to schedule appts after 2 no show appts. Contact information: [address] [place] [address] [phone]                Next level of care provider has access to [facility] Link:yes  Safety Planning and Suicide Prevention discussed: Yes,  Pt reports feeling confident about returning home and have no safety concerns.  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     Has patient been referred to the Quitline?: Yes referral submitted online, pt will receive a  call for follow-up services and resources.   Patient has been referred for addiction treatment: No known substance use disorder.  Steffanie Dunn, LCSW 04/23/2023, 10:49 AM

## 2023-04-23 NOTE — Progress Notes (Signed)
Discharged with all belongings. AVS, transition record and SRA discussed with patient, patient had no questions upon discharge. Verbalized no concerns. Denies SI/HI/AVH upon discharge.

## 2023-04-23 NOTE — Progress Notes (Signed)
   04/23/23 0002  Psych Admission Type (Psych Patients Only)  Admission Status Voluntary  Psychosocial Assessment  Patient Complaints None  Eye Contact Fair  Facial Expression Animated  Affect Depressed  Speech Logical/coherent  Interaction Assertive  Motor Activity Other (Comment) (WNL)  Appearance/Hygiene Unremarkable  Behavior Characteristics Cooperative  Mood Depressed  Thought Process  Coherency WDL  Content WDL  Delusions None reported or observed  Perception WDL  Hallucination None reported or observed  Judgment Impaired  Confusion None  Danger to Self  Current suicidal ideation? Denies  Agreement Not to Harm Self Yes  Description of Agreement verbal  Danger to Others  Danger to Others None reported or observed

## 2023-04-23 NOTE — Group Note (Signed)
Date:  04/23/2023 Time:  3:28 PM  Group Topic/Focus:   Developing a Wellness Toolbox:   The focus of this group is to help patients develop a "wellness toolbox" with skills and strategies to promote recovery upon discharge. Skills and strategies discussed included eating healthy, exercise, maintaining appropriate physical health, keeping the brain stimulated with reading, puzzles, intellectual conversations, and being social.     Participation Level:  Active  Participation Quality:  Appropriate  Affect:  Appropriate  Cognitive:  Appropriate  Insight: Appropriate  Engagement in Group:  Engaged  Modes of Intervention:  Discussion and Education  Additional Comments:  n/a  Cherre Blanc 04/23/2023, 3:28 PM

## 2023-04-23 NOTE — [facility] Group Notes (Signed)
The focus of this group is to help patients establish daily goals to achieve during treatment and discuss how the patient can incorporate goal setting into their daily lives to aide in recovery.  Pt did not attend 
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
## 2023-04-23 NOTE — Plan of Care (Signed)

## 2023-05-21 ENCOUNTER — Other Ambulatory Visit: Payer: Self-pay

## 2023-05-21 ENCOUNTER — Emergency Department (HOSPITAL_COMMUNITY)
Admission: EM | Admit: 2023-05-21 | Discharge: 2023-05-23 | Disposition: A | Discharge status: Other Acute Inpatient Hospital | Payer: MEDICAID | Attending: Emergency Medicine | Admitting: Emergency Medicine

## 2023-05-21 DIAGNOSIS — F19959 Other psychoactive substance use, unspecified with psychoactive substance-induced psychotic disorder, unspecified: Secondary | ICD-10-CM | POA: Diagnosis present

## 2023-05-21 DIAGNOSIS — F250 Schizoaffective disorder, bipolar type: Secondary | ICD-10-CM | POA: Diagnosis present

## 2023-05-21 LAB — URINALYSIS, ROUTINE W REFLEX MICROSCOPIC
Bacteria, UA: NONE SEEN
Bilirubin Urine: NEGATIVE
Glucose, UA: NEGATIVE mg/dL
Ketones, ur: 5 mg/dL — AB
Nitrite: NEGATIVE
Protein, ur: NEGATIVE mg/dL
Specific Gravity, Urine: 1.01 (ref 1.005–1.030)
pH: 5 (ref 5.0–8.0)

## 2023-05-21 LAB — COMPREHENSIVE METABOLIC PANEL
ALT: 34 U/L (ref 0–44)
AST: 58 U/L — ABNORMAL HIGH (ref 15–41)
Albumin: 4.5 g/dL (ref 3.5–5.0)
Alkaline Phosphatase: 60 U/L (ref 38–126)
Anion gap: 9 (ref 5–15)
BUN: 11 mg/dL (ref 6–20)
CO2: 21 mmol/L — ABNORMAL LOW (ref 22–32)
Calcium: 9.2 mg/dL (ref 8.9–10.3)
Chloride: 104 mmol/L (ref 98–111)
Creatinine, Ser: 0.85 mg/dL (ref 0.44–1.00)
GFR, Estimated: >60 mL/min (ref >60)
Glucose, Bld: 107 mg/dL — ABNORMAL HIGH (ref 70–99)
Potassium: 3.6 mmol/L (ref 3.5–5.1)
Sodium: 134 mmol/L — ABNORMAL LOW (ref 135–145)
Total Bilirubin: 0.8 mg/dL (ref 0.3–1.2)
Total Protein: 8.1 g/dL (ref 6.5–8.1)

## 2023-05-21 LAB — CBC WITH DIFFERENTIAL/PLATELET
Abs Immature Granulocytes: 0.01 10*3/uL (ref 0.00–0.07)
Basophils Absolute: 0 10*3/uL (ref 0.0–0.1)
Basophils Relative: 0 %
Eosinophils Absolute: 0 10*3/uL (ref 0.0–0.5)
Eosinophils Relative: 0 %
HCT: 37 % (ref 36.0–46.0)
Hemoglobin: 11.8 g/dL — ABNORMAL LOW (ref 12.0–15.0)
Immature Granulocytes: 0 %
Lymphocytes Relative: 15 %
Lymphs Abs: 1 10*3/uL (ref 0.7–4.0)
MCH: 28.2 pg (ref 26.0–34.0)
MCHC: 31.9 g/dL (ref 30.0–36.0)
MCV: 88.3 fL (ref 80.0–100.0)
Monocytes Absolute: 0.5 10*3/uL (ref 0.1–1.0)
Monocytes Relative: 7 %
Neutro Abs: 5.3 10*3/uL (ref 1.7–7.7)
Neutrophils Relative %: 78 %
Platelets: 214 10*3/uL (ref 150–400)
RBC: 4.19 MIL/uL (ref 3.87–5.11)
RDW: 14 % (ref 11.5–15.5)
WBC: 6.8 10*3/uL (ref 4.0–10.5)
nRBC: 0 % (ref 0.0–0.2)

## 2023-05-21 LAB — RAPID URINE DRUG SCREEN, HOSP PERFORMED
Amphetamines: NOT DETECTED
Barbiturates: NOT DETECTED
Benzodiazepines: NOT DETECTED
Cocaine: NOT DETECTED
Opiates: NOT DETECTED
Tetrahydrocannabinol: NOT DETECTED

## 2023-05-21 LAB — ETHANOL: Alcohol, Ethyl (B): <10 mg/dL (ref <10)

## 2023-05-21 LAB — PREGNANCY, URINE: Preg Test, Ur: NEGATIVE

## 2023-05-21 MED ORDER — CEPHALEXIN 500 MG PO CAPS
500.0000 mg | ORAL_CAPSULE | Freq: Two times a day (BID) | ORAL | Status: DC
  Administered 2023-05-22 – 2023-05-23 (×3): 500 mg via ORAL
  Filled 2023-05-21 (×3): qty 1

## 2023-05-21 MED ORDER — OLANZAPINE 5 MG PO TBDP
5.0000 mg | ORAL_TABLET | Freq: Once | ORAL | Status: AC
  Administered 2023-05-21: 5 mg via ORAL
  Filled 2023-05-21: qty 1

## 2023-05-21 MED ORDER — LORAZEPAM 1 MG PO TABS
1.0000 mg | ORAL_TABLET | Freq: Once | ORAL | Status: AC
  Administered 2023-05-21: 1 mg via ORAL
  Filled 2023-05-21: qty 1

## 2023-05-21 MED ORDER — OLANZAPINE 5 MG PO TBDP: 5.0000 mg | ORAL_TABLET | Freq: Every day | ORAL | Status: DC

## 2023-05-21 MED ORDER — HYDROXYZINE HCL 25 MG PO TABS
25.0000 mg | ORAL_TABLET | Freq: Three times a day (TID) | ORAL | Status: DC | PRN
  Administered 2023-05-22: 25 mg via ORAL
  Filled 2023-05-21: qty 1

## 2023-05-21 MED ORDER — FLUOXETINE HCL 20 MG PO CAPS
20.0000 mg | ORAL_CAPSULE | Freq: Every day | ORAL | Status: DC
  Administered 2023-05-21 – 2023-05-23 (×3): 20 mg via ORAL
  Filled 2023-05-21 (×3): qty 1

## 2023-05-21 MED ORDER — PRAZOSIN HCL 1 MG PO CAPS
1.0000 mg | ORAL_CAPSULE | Freq: Every day | ORAL | Status: DC
  Administered 2023-05-22: 1 mg via ORAL
  Filled 2023-05-21: qty 1

## 2023-05-21 NOTE — ED Triage Notes (Signed)
Pt [place] via EMS [place] the roadside for reports of needing a psych eval. EMS reports extensive psych history and not being on meds for a while. EMS reports pt has a home but was out on the streets without shoes. Pt is poor historian, appearing altered, not answering questions, looking around the room. Pt in NAD, VSS.

## 2023-05-21 NOTE — ED Notes (Signed)
Patient stated she wants to take a shower. NT set up her bath tray but patient went back to sleep.

## 2023-05-21 NOTE — ED Notes (Signed)
Pt accepted to [facility] on Tuesday, Aug 8th Phone # - [phone], leave call back number.  Dr Janalyn Rouse receiving

## 2023-05-21 NOTE — ED Notes (Signed)
Patient refused VS at this time.
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
## 2023-05-21 NOTE — ED Provider Notes (Signed)
[place] EMERGENCY DEPARTMENT AT [facility] Provider Note   CSN: [id number] Arrival date & time: 05/21/23  6213     History  Chief Complaint  Patient presents with   Psychiatric Evaluation    Tammie Parker is a 27 y.o. female.  27 year old female with history of schizophrenia who presents after being found by [employer] walking on the street.  Review of the old chart shows that she does have history schizophrenia.  Patient is minimally verbal at this time.  Daughter states she has not had her medications for some time.  Notes that she is hungry.  No further history obtainable at this time       Home Medications Prior to Admission medications   Medication Sig Start Date End Date Taking? Authorizing Provider  FLUoxetine (PROZAC) 20 MG capsule Take 1 capsule (20 mg total) by mouth daily. 04/23/23   Ntuen, Jesusita Oka, FNP  hydrOXYzine (ATARAX) 25 MG tablet Take 1 tablet (25 mg total) by mouth 3 (three) times daily as needed for anxiety. 04/23/23   Ntuen, Jesusita Oka, FNP  OLANZapine (ZYPREXA) 10 MG tablet Take 1 tablet (10 mg total) by mouth at bedtime. Patient not taking: Reported on 04/17/2023 09/19/22   Shanna Cisco, NP  prazosin (MINIPRESS) 1 MG capsule Take 1 capsule (1 mg total) by mouth at bedtime. 04/23/23   Ntuen, Jesusita Oka, FNP  traZODone (DESYREL) 50 MG tablet Take 1 tablet (50 mg total) by mouth at bedtime as needed for sleep. 04/23/23   Cecilie Lowers, FNP      Allergies    Ziprasidone hcl    Review of Systems   Review of Systems  Unable to perform ROS: Acuity of condition    Physical Exam Updated Vital Signs BP 129/77 (BP Location: Left Arm)   Pulse 89   Temp 98.6 F (37 C) (Oral)   Resp 17   SpO2 100%  Physical Exam Vitals and nursing note reviewed.  Constitutional:      General: She is not in acute distress.    Appearance: Normal appearance. She is well-developed. She is not toxic-appearing.  HENT:     Head: Normocephalic and atraumatic.  Eyes:     General:  Lids are normal.     Conjunctiva/sclera: Conjunctivae normal.     Pupils: Pupils are equal, round, and reactive to light.  Neck:     Thyroid: No thyroid mass.     Trachea: No tracheal deviation.  Cardiovascular:     Rate and Rhythm: Normal rate and regular rhythm.     Heart sounds: Normal heart sounds. No murmur heard.    No gallop.  Pulmonary:     Effort: Pulmonary effort is normal. No respiratory distress.     Breath sounds: Normal breath sounds. No stridor. No decreased breath sounds, wheezing, rhonchi or rales.  Abdominal:     General: There is no distension.     Palpations: Abdomen is soft.     Tenderness: There is no abdominal tenderness. There is no rebound.  Musculoskeletal:        General: No tenderness. Normal range of motion.     Cervical back: Normal range of motion and neck supple.  Skin:    General: Skin is warm and dry.     Findings: No abrasion or rash.  Neurological:     Mental Status: She is alert and oriented to person, place, and time. Mental status is at baseline.     GCS: GCS eye subscore  is 4. GCS verbal subscore is 5. GCS motor subscore is 6.     Cranial Nerves: Cranial nerves are intact. No cranial nerve deficit.     Sensory: No sensory deficit.     Motor: Motor function is intact.  Psychiatric:        Attention and Perception: She is inattentive.        Mood and Affect: Affect is blunt and flat.        Speech: Speech is delayed.        Behavior: Behavior is slowed and withdrawn.     ED Results / Procedures / Treatments   Labs (all labs ordered are listed, but only abnormal results are displayed) Labs Reviewed - No data to display  EKG None  Radiology No results found.  Procedures Procedures    Medications Ordered in ED Medications - No data to display  ED Course/ Medical Decision Making/ A&P                                 Medical Decision Making Amount and/or Complexity of Data Reviewed Labs: ordered.   Patient's labs  reviewed.  She is now medically cleared for psychiatric disposition        Final Clinical Impression(s) / ED Diagnoses Final diagnoses:  None    Rx / DC Orders ED Discharge Orders     None         Lorre Nick, MD 05/21/23 1003

## 2023-05-21 NOTE — ED Notes (Signed)
Pt in room sleeping at this time.

## 2023-05-21 NOTE — Progress Notes (Signed)
Patient has been denied by [facility] due to no appropriate beds available. Patient meets BH inpatient criteria per Dahlia Byes, NP. Patient has been faxed out to the following facilities:   [facility]  [address] [place] [place] [address] [phone]  [facility]  [address] [place] [address] [phone]  [facility]  [address] [place] [address] [phone]  [facility]  [address] [place] [address] [phone]  [facility]  [address] [place] [address] [phone]  [facility]  [address] [place] [address] [phone]  [facility]  [address] [place] [address] [phone]  [facility]  [address] [place] [address] [phone]  [facility]  [address] [place], [place] [address] [phone]  [facility]  [address] [place], [place] [address] [phone]  [facility]  [address] [place] [address] [phone]  [facility]  [address] [place] [address] [phone]  [facility]  [address] [place] [address] [phone]  [facility]  [address], [place] [address] [phone]   Damita Dunnings, MSW, LCSW  2:35 PM 05/21/2023

## 2023-05-21 NOTE — ED Notes (Signed)
Pt went to the restroom and was told my paramedic to leave urine sample, pt took cup out of drawer & didn't leave a sample. Pt was asked about sample and stated that she urinated, but didn't leave sample. Pt was informed to leave sample when she goes to restroom again and was offered something to drink and pt declined.

## 2023-05-21 NOTE — ED Notes (Signed)
Patient belonging bag was given to her mother.

## 2023-05-21 NOTE — ED Notes (Signed)
Patient refused to take her medicine tonight. Patient just went back to sleep when trying to ask if she wants it.

## 2023-05-21 NOTE — ED Notes (Signed)
Pt belongings placed in cabinets labeled "hall C." Includes leggings, shirt, Iphone, wallet and car keys.
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
## 2023-05-21 NOTE — ED Notes (Signed)
Pt refuses vitals  

## 2023-05-21 NOTE — Progress Notes (Signed)
[facility] LCSW Progress Note   05/21/2023    4:28 PM  Tammie Parker   [id number]   Type of Contact and Topic:  Psychiatric Bed Placement   Pt accepted to [facility]    Patient meets inpatient criteria per Dahlia Byes, NP  The attending provider will be Janalyn Rouse, MD   Call report to [phone]  April Wilson, Paramedic @ [facility] notified.     Pt scheduled  to arrive at [facility] for TUESDAY AUGUST 6 AFTER 0900.    Damita Dunnings, MSW, LCSW-A  4:30 PM 05/21/2023

## 2023-05-22 DIAGNOSIS — F250 Schizoaffective disorder, bipolar type: Secondary | ICD-10-CM

## 2023-05-22 MED ORDER — LORAZEPAM 1 MG PO TABS
1.0000 mg | ORAL_TABLET | Freq: Three times a day (TID) | ORAL | Status: DC | PRN
  Administered 2023-05-22 – 2023-05-23 (×2): 1 mg via ORAL
  Filled 2023-05-22 (×2): qty 1

## 2023-05-22 MED ORDER — OLANZAPINE 10 MG PO TBDP
10.0000 mg | ORAL_TABLET | Freq: Every day | ORAL | Status: DC
  Administered 2023-05-22: 10 mg via ORAL
  Filled 2023-05-22: qty 1

## 2023-05-22 NOTE — ED Notes (Signed)
Breakfast tray given to pt 

## 2023-05-22 NOTE — ED Notes (Signed)
Pt walking in hallway yelling making gestures with hands. Pt back to her room then back to the hallway pacing. Pt back to her room doing what looks like workout exercises in her room. Pt offered medication to calm her and she agreed.

## 2023-05-22 NOTE — ED Notes (Signed)
Pt is pacing back and forth in the room.

## 2023-05-22 NOTE — ED Notes (Signed)
Pt is eating breakfast and has had morning meds. No inappropriate behaviors noted at this time.

## 2023-05-22 NOTE — Progress Notes (Signed)
[facility] Psych ED Progress Note  05/22/2023 1:29 PM Tammie Parker  MRN:  [id number]   Subjective:  Tammie Parker is a 27 y.o. female patient admitted with previous hx of Schizoaffective disorder, Bipolar type, PTSD and Autism Spectrum disorder brought in by EMS. Brought in from the street walking bare foot.  Patient informed EMS that she needed Psychiatry evaluation.  Patient was seen awake in bed calm.  She reports some improvement with sleep last hight since she started taking medications.  Patient continues to look around as if she is hearing and seeing things.  She paces around her room and the hall way and back to her room.  So far she is compliant in with her medications.  She has been accepted at [facility] in [place] for tomorrow..  We will continue to monitor patient. Principal Problem: Schizoaffective disorder, bipolar type (HCC) Diagnosis:  Principal Problem:   Schizoaffective disorder, bipolar type [facility])   ED Assessment Time Calculation: Start Time: 1305 Stop Time: 1325 Total Time in Minutes (Assessment Completion): 20   Past Psychiatric History: see initial Psychiatry evaluation note  [place] Scale:  Flowsheet Row [facility] Most recent reading at 04/17/2023  5:00 PM ED from 04/17/2023 in [facility] Most recent reading at 04/17/2023  8:59 AM ED from 04/18/2022 in [facility] Emergency Department at [facility] Most recent reading at 04/18/2022  7:40 AM  C-SSRS RISK CATEGORY No Risk No Risk No Risk       Past Medical History:  Past Medical History:  Diagnosis Date   Medical history non-contributory    No past surgical history on file. Family History: No family history on file. Family Psychiatric  History: see initial Psychiatry evaluation note Social History:  Social History   Substance and Sexual Activity  Alcohol Use Yes   Alcohol/week: 6.0 standard drinks of alcohol    Types: 6 Glasses of wine per week     Social History   Substance and Sexual Activity  Drug Use Yes   Types: Psilocybin, Marijuana    Social History   Socioeconomic History   Marital status: Single    Spouse name: Not on file   Number of children: Not on file   Years of education: Not on file   Highest education level: Not on file  Occupational History   Not on file  Tobacco Use   Smoking status: Never   Smokeless tobacco: Never  Substance and Sexual Activity   Alcohol use: Yes    Alcohol/week: 6.0 standard drinks of alcohol    Types: 6 Glasses of wine per week   Drug use: Yes    Types: Psilocybin, Marijuana   Sexual activity: Not on file  Other Topics Concern   Not on file  Social History Narrative   Not on file   Social Determinants of Health   Financial Resource Strain: High Risk (04/17/2023)   Overall Financial Resource Strain (CARDIA)    Difficulty of Paying Living Expenses: Very hard  Food Insecurity: Patient Declined (04/17/2023)   Hunger Vital Sign    Worried About Running Out of Food in the Last Year: Patient declined    Ran Out of Food in the Last Year: Patient declined  Transportation Needs: Patient Unable To Answer (04/17/2023)   PRAPARE - Administrator, Civil Service (Medical): Patient unable to answer    Lack of Transportation (Non-Medical): Patient unable to answer  Physical Activity:  Sufficiently Active (04/17/2023)   Exercise Vital Sign    Days of Exercise per Week: 4 days    Minutes of Exercise per Session: 50 min  Stress: Stress Concern Present (04/17/2023)   [employer] of Occupational Health - Occupational Stress Questionnaire    Feeling of Stress : Very much  Social Connections: Socially Isolated (04/17/2023)   Social Connection and Isolation Panel [NHANES]    Frequency of Communication with Friends and Family: Once a week    Frequency of Social Gatherings with Friends and Family: Never    Attends Religious Services: Never    Automotive engineer or Organizations: No    Attends Engineer, structural: Never    Marital Status: Never married    Sleep: Fair  Appetite:  Fair  Current Medications: Current Facility-Administered Medications  Medication Dose Route Frequency Provider Last Rate Last Admin   cephALEXin (KEFLEX) capsule 500 mg  500 mg Oral Q12H Lorre Nick, MD   500 mg at 05/22/23 0926   FLUoxetine (PROZAC) capsule 20 mg  20 mg Oral Daily Dahlia Byes C, NP   20 mg at 05/22/23 1324   hydrOXYzine (ATARAX) tablet 25 mg  25 mg Oral TID PRN Earney Navy, NP       OLANZapine zydis (ZYPREXA) disintegrating tablet 5 mg  5 mg Oral QHS Marshel Golubski C, NP       prazosin (MINIPRESS) capsule 1 mg  1 mg Oral QHS Joanthony Hamza C, NP       Current Outpatient Medications  Medication Sig Dispense Refill   FLUoxetine (PROZAC) 20 MG capsule Take 1 capsule (20 mg total) by mouth daily. 30 capsule 3   hydrOXYzine (ATARAX) 25 MG tablet Take 1 tablet (25 mg total) by mouth 3 (three) times daily as needed for anxiety. (Patient taking differently: Take 25 mg by mouth daily.) 30 tablet 0   traZODone (DESYREL) 50 MG tablet Take 1 tablet (50 mg total) by mouth at bedtime as needed for sleep. 30 tablet 0   OLANZapine (ZYPREXA) 10 MG tablet Take 1 tablet (10 mg total) by mouth at bedtime. (Patient not taking: Reported on 05/22/2023) 30 tablet 3   prazosin (MINIPRESS) 1 MG capsule Take 1 capsule (1 mg total) by mouth at bedtime. (Patient not taking: Reported on 05/22/2023) 30 capsule 0    Lab Results:  Results for orders placed or performed during the hospital encounter of 05/21/23 (from the past 48 hour(s))  CBC with Differential/Platelet     Status: Abnormal   Collection Time: 05/21/23  9:20 AM  Result Value Ref Range   WBC 6.8 4.0 - 10.5 K/uL   RBC 4.19 3.87 - 5.11 MIL/uL   Hemoglobin 11.8 (L) 12.0 - 15.0 g/dL   HCT 40.1 02.7 - 25.3 %   MCV 88.3 80.0 - 100.0 fL   MCH 28.2 26.0 - 34.0 pg   MCHC 31.9  30.0 - 36.0 g/dL   RDW 66.4 40.3 - 47.4 %   Platelets 214 150 - 400 K/uL   nRBC 0.0 0.0 - 0.2 %   Neutrophils Relative % 78 %   Neutro Abs 5.3 1.7 - 7.7 K/uL   Lymphocytes Relative 15 %   Lymphs Abs 1.0 0.7 - 4.0 K/uL   Monocytes Relative 7 %   Monocytes Absolute 0.5 0.1 - 1.0 K/uL   Eosinophils Relative 0 %   Eosinophils Absolute 0.0 0.0 - 0.5 K/uL   Basophils Relative 0 %   Basophils Absolute 0.0  0.0 - 0.1 K/uL   Immature Granulocytes 0 %   Abs Immature Granulocytes 0.01 0.00 - 0.07 K/uL    Comment: Performed at [facility] [address], [place], [place] [address]  Comprehensive metabolic panel     Status: Abnormal   Collection Time: 05/21/23  9:20 AM  Result Value Ref Range   Sodium 134 (L) 135 - 145 mmol/L   Potassium 3.6 3.5 - 5.1 mmol/L   Chloride 104 98 - 111 mmol/L   CO2 21 (L) 22 - 32 mmol/L   Glucose, Bld 107 (H) 70 - 99 mg/dL    Comment: Glucose reference range applies only to samples taken after fasting for at least 8 hours.   BUN 11 6 - 20 mg/dL   Creatinine, Ser 6.04 0.44 - 1.00 mg/dL   Calcium 9.2 8.9 - 54.0 mg/dL   Total Protein 8.1 6.5 - 8.1 g/dL   Albumin 4.5 3.5 - 5.0 g/dL   AST 58 (H) 15 - 41 U/L   ALT 34 0 - 44 U/L   Alkaline Phosphatase 60 38 - 126 U/L   Total Bilirubin 0.8 0.3 - 1.2 mg/dL   GFR, Estimated >98 >11 mL/min    Comment: (NOTE) Calculated using the CKD-EPI Creatinine Equation (2021)    Anion gap 9 5 - 15    Comment: Performed at [facility], [address], [place], [place] [address]  Ethanol     Status: None   Collection Time: 05/21/23  9:20 AM  Result Value Ref Range   Alcohol, Ethyl (B) <10 <10 mg/dL    Comment: (NOTE) Lowest detectable limit for serum alcohol is 10 mg/dL.  For medical purposes only. Performed at [facility], [address] [place], [place] [address]   Rapid urine drug screen (hospital performed)     Status: None   Collection Time: 05/21/23 11:23  AM  Result Value Ref Range   Opiates NONE DETECTED NONE DETECTED   Cocaine NONE DETECTED NONE DETECTED   Benzodiazepines NONE DETECTED NONE DETECTED   Amphetamines NONE DETECTED NONE DETECTED   Tetrahydrocannabinol NONE DETECTED NONE DETECTED   Barbiturates NONE DETECTED NONE DETECTED    Comment: (NOTE) DRUG SCREEN FOR MEDICAL PURPOSES ONLY.  IF CONFIRMATION IS NEEDED FOR ANY PURPOSE, NOTIFY LAB WITHIN 5 DAYS.  LOWEST DETECTABLE LIMITS FOR URINE DRUG SCREEN Drug Class                     Cutoff (ng/mL) Amphetamine and metabolites    1000 Barbiturate and metabolites    200 Benzodiazepine                 200 Opiates and metabolites        300 Cocaine and metabolites        300 THC                            50 Performed at [facility], [address], [place], [place] [address]   Urinalysis, Routine w reflex microscopic -Urine, Clean Catch     Status: Abnormal   Collection Time: 05/21/23 11:23 AM  Result Value Ref Range   Color, Urine YELLOW YELLOW   APPearance HAZY (A) CLEAR   Specific Gravity, Urine 1.010 1.005 - 1.030   pH 5.0 5.0 - 8.0   Glucose, UA NEGATIVE NEGATIVE mg/dL   Hgb urine dipstick MODERATE (A) NEGATIVE  Bilirubin Urine NEGATIVE NEGATIVE   Ketones, ur 5 (A) NEGATIVE mg/dL   Protein, ur NEGATIVE NEGATIVE mg/dL   Nitrite NEGATIVE NEGATIVE   Leukocytes,Ua LARGE (A) NEGATIVE   RBC / HPF 11-20 0 - 5 RBC/hpf   WBC, UA 6-10 0 - 5 WBC/hpf   Bacteria, UA NONE SEEN NONE SEEN   Squamous Epithelial / HPF 0-5 0 - 5 /HPF   Mucus PRESENT     Comment: Performed at [facility], [address], [place], [place] [address]  Pregnancy, urine     Status: None   Collection Time: 05/21/23 11:23 AM  Result Value Ref Range   Preg Test, Ur NEGATIVE NEGATIVE    Comment:        THE SENSITIVITY OF THIS METHODOLOGY IS >25 mIU/mL. Performed at [facility], [address], [place], [place] [address]     Blood Alcohol  level:  Lab Results  Component Value Date   [facility] <10 05/21/2023   ETH <10 04/17/2023    Physical Findings:  CIWA:    COWS:     Musculoskeletal: Strength & Muscle Tone: within normal limits Gait & Station: normal Patient leans: Front  Psychiatric Specialty Exam:  Presentation  General Appearance:  Fairly Groomed  Eye Contact: Fleeting  Speech: Blocked; Other (comment) (minimal words.  Hesitant)  Speech Volume: Decreased  Handedness: Right   Mood and Affect  Mood: Anxious  Affect: Congruent   Thought Process  Thought Processes: Disorganized  Descriptions of Associations:Loose  Orientation:Partial  Thought Content:Illogical  History of Schizophrenia/Schizoaffective disorder:Yes  Duration of Psychotic Symptoms:Greater than six months  Hallucinations:Hallucinations: Auditory; Visual Description of Auditory Hallucinations: Unable to state what she is hearing but looking around room responding toi something saying "you, hat" Description of Visual Hallucinations: Looking around the room from side to side.  Ideas of Reference:None  Suicidal Thoughts:No data recorded Homicidal Thoughts:No data recorded  Sensorium  Memory: Immediate Poor; Recent Poor; Remote Poor  Judgment: Impaired  Insight: Lacking   Executive Functions  Concentration: Poor  Attention Span: Poor  Recall: Poor  Fund of Knowledge: Poor  Language: Poor   Psychomotor Activity  Psychomotor Activity: Psychomotor Activity: Restlessness   Assets  Assets: Physical Health   Sleep  Sleep: Sleep: Poor    Physical Exam: Physical Exam Vitals and nursing note reviewed.  Constitutional:      Appearance: Normal appearance.  HENT:     Head: Normocephalic.     Nose: Nose normal.  Cardiovascular:     Rate and Rhythm: Normal rate and regular rhythm.  Pulmonary:     Effort: Pulmonary effort is normal.  Musculoskeletal:        General: Normal range of motion.      Cervical back: Normal range of motion.  Skin:    General: Skin is dry.  Neurological:     Mental Status: She is alert and oriented to person, place, and time.  Psychiatric:        Attention and Perception: Attention normal.        Mood and Affect: Mood is anxious. Affect is blunt.        Speech: Speech normal.        Behavior: Behavior is cooperative.        Thought Content: Thought content normal.        Cognition and Memory: Cognition is impaired.        Judgment: Judgment is inappropriate.    Review of Systems  Constitutional: Negative.  HENT: Negative.    Eyes: Negative.   Respiratory: Negative.    Cardiovascular: Negative.   Gastrointestinal: Negative.   Genitourinary: Negative.   Musculoskeletal: Negative.   Skin: Negative.   Neurological: Negative.   Endo/Heme/Allergies: Negative.   Psychiatric/Behavioral:  The patient is nervous/anxious.    Blood pressure 127/65, pulse 65, temperature 98 F (36.7 C), temperature source Oral, resp. rate 16, SpO2 98%. There is no height or weight on file to calculate BMI.   Medical Decision Making: Patient has been accepted at [facility] in [place] and will be transferred tomorrow.  She is taking her medications while in the unit.  Earney Navy, NP-PMHNP-BC 05/22/2023, 1:29 PM

## 2023-05-22 NOTE — ED Notes (Signed)
Pt pacing back and forth in room while watching TV. No inappropriate behaviors observed at this time.

## 2023-05-22 NOTE — ED Provider Notes (Signed)
Emergency Medicine Observation Re-evaluation Note  Tammie Parker is a 27 y.o. female, seen on rounds today.  Pt initially presented to the ED for complaints of Psychiatric Evaluation Currently, the patient is pacing around room  Physical Exam  BP 112/65 (BP Location: Left Arm)   Pulse 61   Temp 98.8 F (37.1 C) (Oral)   Resp 16   SpO2 99%  Physical Exam General: No acute distress Cardiac: Well-perfused Lungs: Nonlabored Psych: aggitated  ED Course / MDM  EKG:   I have reviewed the labs performed to date as well as medications administered while in observation.  Recent changes in the last 24 hours include psychiatric evaluation.  Plan  Current plan is for inpatient [facility].    Terrilee Files, MD 05/22/23 [phone]

## 2023-05-22 NOTE — ED Notes (Signed)
Pt mother [name] called in regard to updates on patient. Okay'd by patient to speak with her mother.

## 2023-05-22 NOTE — ED Notes (Signed)
Pt pacing back and forth in the room.

## 2023-05-22 NOTE — ED Notes (Signed)
Pt in bathroom

## 2023-05-22 NOTE — ED Notes (Signed)
Pt was pacing back forth in hallway to room. Meds given to relax pt.

## 2023-05-23 NOTE — ED Notes (Signed)
Pt in shower.  

## 2023-05-23 NOTE — ED Notes (Signed)
Pt signed voluntary transport and treatment forms

## 2023-05-23 NOTE — ED Provider Notes (Signed)
Emergency Medicine Observation Re-evaluation Note  Nykea Jacek is a 27 y.o. female, seen on rounds today.  Pt initially presented to the ED for complaints of Psychiatric Evaluation Currently, the patient is walking around.  Physical Exam  BP 123/82 (BP Location: Left Arm)   Pulse 86   Temp 97.9 F (36.6 C) (Oral)   Resp 20   SpO2 100%  Physical Exam General: No acute distress Cardiac: Normal rate Lungs: No increased work of breathing Psych: Calm  ED Course / MDM  EKG:   I have reviewed the labs performed to date as well as medications administered while in observation.  Recent changes in the last 24 hours include none.  Plan  Current plan is for inpatient placement.  Patient has been accepted to [facility].Rolan Bucco, MD 05/23/23 [phone]
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
## 2023-05-23 NOTE — ED Notes (Signed)
Pt made a phone call this morning due to safe transport on the way to get pt.

## 2023-05-23 NOTE — ED Notes (Signed)
Breakfast Tray delivered

## 2023-05-23 NOTE — ED Notes (Signed)
Safe transport to call when they arrive to transport pt

## 2023-06-14 ENCOUNTER — Ambulatory Visit (HOSPITAL_COMMUNITY): Payer: MEDICAID | Admitting: Licensed Clinical Social Worker

## 2023-06-26 ENCOUNTER — Ambulatory Visit (HOSPITAL_COMMUNITY): Payer: MEDICAID | Admitting: Psychiatry

## 2024-09-04 ENCOUNTER — Emergency Department (HOSPITAL_COMMUNITY)
Admission: EM | Admit: 2024-09-04 | Discharge: 2024-09-05 | Disposition: A | Discharge status: Other Acute Inpatient Hospital | Payer: MEDICAID

## 2024-09-04 ENCOUNTER — Emergency Department (HOSPITAL_COMMUNITY): Payer: MEDICAID

## 2024-09-04 ENCOUNTER — Other Ambulatory Visit: Payer: Self-pay

## 2024-09-04 DIAGNOSIS — Z79899 Other long term (current) drug therapy: Secondary | ICD-10-CM | POA: Diagnosis not present

## 2024-09-04 DIAGNOSIS — M79671 Pain in right foot: Secondary | ICD-10-CM | POA: Insufficient documentation

## 2024-09-04 DIAGNOSIS — F203 Undifferentiated schizophrenia: Secondary | ICD-10-CM

## 2024-09-04 DIAGNOSIS — F22 Delusional disorders: Secondary | ICD-10-CM | POA: Diagnosis present

## 2024-09-04 DIAGNOSIS — M79672 Pain in left foot: Secondary | ICD-10-CM | POA: Insufficient documentation

## 2024-09-04 LAB — BASIC METABOLIC PANEL WITH GFR
Anion gap: 13 (ref 5–15)
BUN: 12 mg/dL (ref 6–20)
CO2: 17 mmol/L — ABNORMAL LOW (ref 22–32)
Calcium: 9 mg/dL (ref 8.9–10.3)
Chloride: 106 mmol/L (ref 98–111)
Creatinine, Ser: 0.89 mg/dL (ref 0.44–1.00)
GFR, Estimated: >60 mL/min (ref >60)
Glucose, Bld: 110 mg/dL — ABNORMAL HIGH (ref 70–99)
Potassium: 3.2 mmol/L — ABNORMAL LOW (ref 3.5–5.1)
Sodium: 136 mmol/L (ref 135–145)

## 2024-09-04 LAB — URINALYSIS, ROUTINE W REFLEX MICROSCOPIC
Bilirubin Urine: NEGATIVE
Glucose, UA: NEGATIVE mg/dL
Ketones, ur: 80 mg/dL — AB
Nitrite: NEGATIVE
Protein, ur: NEGATIVE mg/dL
Specific Gravity, Urine: 1.029 (ref 1.005–1.030)
pH: 5 (ref 5.0–8.0)

## 2024-09-04 LAB — ETHANOL: Alcohol, Ethyl (B): <15 mg/dL (ref <15)

## 2024-09-04 LAB — CBC WITH DIFFERENTIAL/PLATELET
Abs Immature Granulocytes: 0.04 K/uL (ref 0.00–0.07)
Basophils Absolute: 0 K/uL (ref 0.0–0.1)
Basophils Relative: 0 %
Eosinophils Absolute: 0 K/uL (ref 0.0–0.5)
Eosinophils Relative: 0 %
HCT: 31.6 % — ABNORMAL LOW (ref 36.0–46.0)
Hemoglobin: 10.5 g/dL — ABNORMAL LOW (ref 12.0–15.0)
Immature Granulocytes: 0 %
Lymphocytes Relative: 15 %
Lymphs Abs: 1.4 K/uL (ref 0.7–4.0)
MCH: 28.2 pg (ref 26.0–34.0)
MCHC: 33.2 g/dL (ref 30.0–36.0)
MCV: 84.7 fL (ref 80.0–100.0)
Monocytes Absolute: 0.6 K/uL (ref 0.1–1.0)
Monocytes Relative: 7 %
Neutro Abs: 7.1 K/uL (ref 1.7–7.7)
Neutrophils Relative %: 78 %
Platelets: 255 K/uL (ref 150–400)
RBC: 3.73 MIL/uL — ABNORMAL LOW (ref 3.87–5.11)
RDW: 14.6 % (ref 11.5–15.5)
WBC: 9.2 K/uL (ref 4.0–10.5)
nRBC: 0 % (ref 0.0–0.2)

## 2024-09-04 LAB — RAPID URINE DRUG SCREEN, HOSP PERFORMED
Amphetamines: NOT DETECTED
Barbiturates: NOT DETECTED
Benzodiazepines: NOT DETECTED
Cocaine: NOT DETECTED
Opiates: NOT DETECTED
Tetrahydrocannabinol: NOT DETECTED

## 2024-09-04 LAB — HCG, SERUM, QUALITATIVE: Preg, Serum: NEGATIVE

## 2024-09-04 MED ORDER — BENZTROPINE MESYLATE 1 MG PO TABS: 1.0000 mg | ORAL_TABLET | Freq: Two times a day (BID) | ORAL | Status: DC | PRN

## 2024-09-04 MED ORDER — OLANZAPINE 5 MG PO TABS: 5.0000 mg | ORAL_TABLET | Freq: Four times a day (QID) | ORAL | Status: DC | PRN

## 2024-09-04 MED ORDER — LORAZEPAM 1 MG PO TABS: 1.0000 mg | ORAL_TABLET | ORAL | Status: DC | PRN

## 2024-09-04 MED ORDER — OLANZAPINE 10 MG IM SOLR: 5.0000 mg | Freq: Four times a day (QID) | INTRAMUSCULAR | Status: DC | PRN

## 2024-09-04 MED ORDER — BENZTROPINE MESYLATE 1 MG/ML IJ SOLN: 1.0000 mg | Freq: Two times a day (BID) | INTRAMUSCULAR | Status: DC | PRN

## 2024-09-04 MED ORDER — OLANZAPINE 5 MG PO TBDP
5.0000 mg | ORAL_TABLET | Freq: Every day | ORAL | Status: DC
  Administered 2024-09-05: 5 mg via ORAL
  Filled 2024-09-04: qty 1
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
## 2024-09-04 NOTE — ED Notes (Signed)
 TTS in process

## 2024-09-04 NOTE — BH Assessment (Addendum)
 Clinician spoke to Lauren with [facility] to complete pt's TTS assessment. Clinician provided pt's name, MRN, location, age, room number and provider's name. Secure message completed.    [facility] coordinator to update secure chat when assessment time and provider are assigned.   Jackson JONETTA Broach, MS, [facility], [facility] Triage Specialist [phone]

## 2024-09-04 NOTE — ED Notes (Signed)
 Pt changed into paper scrubs.
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
## 2024-09-04 NOTE — ED Notes (Signed)
 Belongings placed in locker 2. Pt requested her purse be locked with security. Purse placed in bag with label and put in security office

## 2024-09-04 NOTE — Consult Note (Signed)
 [facility] Consult Note  Patient Name: Tammie Parker MRN: [id number] DOB: 1996-06-17 DATE OF Consult: 09/04/2024  PRIMARY PSYCHIATRIC DIAGNOSES   1.  Undifferentiated Schizophrenia   RECOMMENDATIONS  Recommendations: Medication recommendations: Begin Zyprexa  Zydis, 5 mg SL at bedtime for psychosis/mood control;  PRN's:  Ativan  1 mg po q4h PRN anxiety; Zyprexa  Zydis, 5 mg SL q6h PRN agitation; Zyprexa , 5 mg IM q6h PRN severe agitation Non-Medication/therapeutic recommendations: Patient still actively psychotic and disorganized, and will continue to need close observation, per ED protocol, until can be safely admitted to psychiatric service. Continue with matter-of-fact emotional support in ED, pending transfer Is inpatient psychiatric hospitalization recommended for this patient? Yes (Explain why): Patient remains actively hallucinating, confused in her thinking, and admits that has not been meeting ADL's such as food intake.  Meets IVC criteria for grave disability, and needs admission to hospital for safety and stabilization Is another care setting recommended for this patient? (examples may include Crisis Stabilization Unit, Residential/Recovery Treatment, ALF/SNF, Memory Care Unit)  No (Explain why): As above From a psychiatric perspective, is this patient appropriate for discharge to an outpatient setting/resource or other less restrictive environment for continued care?  No (Explain why): As above Follow-Up Telepsychiatry C/L services: We will sign off for now. Please re-consult our service if needed for any concerning changes in the patient's condition, discharge planning, or questions. Communication: Treatment team members (and family members if applicable) who were involved in treatment/care discussions and planning, and with whom we spoke or engaged with via secure text/chat, include the following: Secure message sent to Dr. Gennaro, ED attending, and staff, outlining  recommendations  Thank you for involving us  in the care of this patient. If you have any additional questions or concerns, please call [phone] and ask for the provider on-call.   TELEPSYCHIATRY ATTESTATION & CONSENT  As the provider for this telehealth consult, I attest that I verified the patient's identity using two separate identifiers, introduced myself to the patient, provided my credentials, disclosed my location, and performed this encounter via a HIPAA-compliant, real-time, face-to-face, two-way, interactive audio and video platform and with the full consent and agreement of the patient (or guardian as applicable.)   Patient physical location: ED, [facility]. Telehealth provider physical location: home office in state of [place] .  Video start time: 2310 EST  Video end time: 2325h EST   Total time spent in this encounter was 30 minutes, including record review, clinical interview, behavior observations, discussion of impressions and recommendations (including medications and hospitalization), and consultation/communication with relevant parties   IDENTIFYING DATA  Tammie Parker is a 28 y.o. year-old female for whom a psychiatric consultation has been ordered by the primary provider. The patient was identified using two separate identifiers.  CHIEF COMPLAINT/REASON FOR CONSULT   I keep hearing these voices, and they tell me bad things, and I don't know what to do.   HISTORY OF PRESENT ILLNESS (HPI)   The patient presents with long history of psychosis, dating back to early twenties, with symptoms consistent with schizophrenia.  Brought to ED by authorities after her having been throwing items out of her second-story apartment balcony and not responding to intervention.  Patient appears quite confused and frightened, and was unable to give much coherent history.  Admitted that has had treatment in the past in psychiatric unit, with last admission in July 2024.  Has not  been going to any outpatient treatment, nor has recently been on meds.  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  Admits that over past few weeks, has been flooded with auditory hallucinations that criticize her and torment her, and she implied that she may have had some command hallucinations, although she denied suicidal at this time.  Apartment was in much disarray, and patient admits that she has not been eating regularly.  Has been sleeping in water  in her bathtub, but cannot explain why.  Reports periodic cannabis and EtOH usage, but denies other drugs.  BAL and UDS negative.   PAST PSYCHIATRIC HISTORY  As above Otherwise as per HPI above.  PAST MEDICAL HISTORY  Past Medical History:  Diagnosis Date   Medical history non-contributory    See ED provider H&P   HOME MEDICATIONS  Facility Ordered Medications  Medication   OLANZapine  zydis (ZYPREXA ) disintegrating tablet 5 mg   LORazepam  (ATIVAN ) tablet 1 mg   OLANZapine  (ZYPREXA ) tablet 5 mg   OLANZapine  (ZYPREXA ) injection 5 mg   benztropine  (COGENTIN ) tablet 1 mg   Or   benztropine  mesylate (COGENTIN ) injection 1 mg   PTA Medications  Medication Sig   OLANZapine  (ZYPREXA ) 10 MG tablet Take 1 tablet (10 mg total) by mouth at bedtime. (Patient not taking: Reported on 05/22/2023)   prazosin  (MINIPRESS ) 1 MG capsule Take 1 capsule (1 mg total) by mouth at bedtime. (Patient not taking: Reported on 05/22/2023)   FLUoxetine  (PROZAC ) 20 MG capsule Take 1 capsule (20 mg total) by mouth daily.   hydrOXYzine  (ATARAX ) 25 MG tablet Take 1 tablet (25 mg total) by mouth 3 (three) times daily as needed for anxiety. (Patient taking differently: Take 25 mg by mouth daily.)   traZODone  (DESYREL ) 50 MG tablet Take 1 tablet (50 mg total) by mouth at bedtime as needed for sleep.     ALLERGIES  Allergies  Allergen Reactions   Ziprasidone  Hcl Other (See  Comments)    Unknown    SOCIAL & SUBSTANCE USE HISTORY  Social History   Socioeconomic History   Marital status: Single    Spouse name: Not on file   Number of children: Not on file   Years of education: Not on file   Highest education level: Not on file  Occupational History   Not on file  Tobacco Use   Smoking status: Never   Smokeless tobacco: Never  Substance and Sexual Activity   Alcohol use: Yes    Alcohol/week: 6.0 standard drinks of alcohol    Types: 6 Glasses of wine per week   Drug use: Yes    Types: Psilocybin, Marijuana   Sexual activity: Not on file  Other Topics Concern   Not on file  Social History Narrative   Not on file   Social Drivers of Health   Financial Resource Strain: High Risk (04/17/2023)   Overall Financial Resource Strain (CARDIA)    Difficulty of Paying Living Expenses: Very hard  Food Insecurity: Patient Declined (04/17/2023)   Hunger Vital Sign    Worried About Running Out of Food in the Last Year: Patient declined    Ran Out of Food in the Last Year: Patient declined  Transportation Needs: Patient Unable To Answer (04/17/2023)   PRAPARE - Transportation    Lack of Transportation (Medical): Patient unable to answer    Lack of Transportation (Non-Medical): Patient unable to answer  Physical Activity: Sufficiently Active (04/17/2023)   Exercise Vital Sign    Days of  Exercise per Week: 4 days    Minutes of Exercise per Session: 50 min  Stress: Stress Concern Present (04/17/2023)   [employer] of Occupational Health - Occupational Stress Questionnaire    Feeling of Stress : Very much  Social Connections: Socially Isolated (04/17/2023)   Social Connection and Isolation Panel    Frequency of Communication with Friends and Family: Once a week    Frequency of Social Gatherings with Friends and Family: Never    Attends Religious Services: Never    Database Administrator or Organizations: No    Attends Engineer, Structural: Never     Marital Status: Never married   Social History   Tobacco Use  Smoking Status Never  Smokeless Tobacco Never   Social History   Substance and Sexual Activity  Alcohol Use Yes   Alcohol/week: 6.0 standard drinks of alcohol   Types: 6 Glasses of wine per week   Social History   Substance and Sexual Activity  Drug Use Yes   Types: Psilocybin, Marijuana    Additional pertinent information Family lives in [place] .  Unclear how much contact she has with them.   FAMILY HISTORY  No family history on file. Family Psychiatric History (if known):  Did not report  MENTAL STATUS EXAM (MSE)  Mental Status Exam: General Appearance: Guarded  Orientation:  Full (Time, Place, and Person)  Memory:  Immediate;   Fair Recent;   Fair Remote;   Fair  Concentration:  Concentration: Poor and Attention Span: Poor  Recall:  Fair  Attention  Poor  Eye Contact:  Poor  Speech:  Slow  Language:  Good  Volume:  Decreased  Mood: I'm afraid  Affect:  Depressed and Flat  Thought Process:  Disorganized and Descriptions of Associations: Tangential  Thought Content:  Hallucinations: Auditory Possibly command, Rumination, and Tangential  Suicidal Thoughts:  No  Homicidal Thoughts:  No  Judgement:  Impaired  Insight:  Lacking  Psychomotor Activity:  Decreased  Akathisia:  No  Fund of Knowledge:  Fair    Assets:  Communication Skills Desire for Improvement Housing  Cognition:  WNL  ADL's:  Impaired  AIMS (if indicated):       VITALS  Blood pressure 122/78, pulse 79, temperature 98.1 F (36.7 C), temperature source Oral, SpO2 100%.  LABS  Admission on 09/04/2024  Component Date Value Ref Range Status   Preg, Serum 09/04/2024 NEGATIVE  NEGATIVE Final   Comment:        THE SENSITIVITY OF THIS METHODOLOGY IS >10 mIU/mL. Performed at [facility], [address] [place], [place] [address]    Sodium 09/04/2024 136  135 - 145 mmol/L Final   Potassium 09/04/2024 3.2 (L)  3.5 - 5.1  mmol/L Final   Chloride 09/04/2024 106  98 - 111 mmol/L Final   CO2 09/04/2024 17 (L)  22 - 32 mmol/L Final   Glucose, Bld 09/04/2024 110 (H)  70 - 99 mg/dL Final   Glucose reference range applies only to samples taken after fasting for at least 8 hours.   BUN 09/04/2024 12  6 - 20 mg/dL Final   Creatinine, Ser 09/04/2024 0.89  0.44 - 1.00 mg/dL Final   Calcium 88/80/7974 9.0  8.9 - 10.3 mg/dL Final   GFR, Estimated 09/04/2024 >60  >60 mL/min Final   Comment: (NOTE) Calculated using the CKD-EPI Creatinine Equation (2021)    Anion gap 09/04/2024 13  5 - 15 Final   Performed at [facility], [address] [place], [place] [address]   WBC 09/04/2024 9.2  4.0 - 10.5 K/uL Final   RBC 09/04/2024 3.73 (L)  3.87 - 5.11 MIL/uL Final   Hemoglobin 09/04/2024 10.5 (L)  12.0 - 15.0 g/dL Final   HCT 88/80/7974 31.6 (L)  36.0 - 46.0 % Final   MCV 09/04/2024 84.7  80.0 - 100.0 fL Final   MCH 09/04/2024 28.2  26.0 - 34.0 pg Final   MCHC 09/04/2024 33.2  30.0 - 36.0 g/dL Final   RDW 88/80/7974 14.6  11.5 - 15.5 % Final   Platelets 09/04/2024 255  150 - 400 K/uL Final   nRBC 09/04/2024 0.0  0.0 - 0.2 % Final   Neutrophils Relative % 09/04/2024 78  % Final   Neutro Abs 09/04/2024 7.1  1.7 - 7.7 K/uL Final   Lymphocytes Relative 09/04/2024 15  % Final   Lymphs Abs 09/04/2024 1.4  0.7 - 4.0 K/uL Final   Monocytes Relative 09/04/2024 7  % Final   Monocytes Absolute 09/04/2024 0.6  0.1 - 1.0 K/uL Final   Eosinophils Relative 09/04/2024 0  % Final   Eosinophils Absolute 09/04/2024 0.0  0.0 - 0.5 K/uL Final   Basophils Relative 09/04/2024 0  % Final   Basophils Absolute 09/04/2024 0.0  0.0 - 0.1 K/uL Final   Immature Granulocytes 09/04/2024 0  % Final   Abs Immature Granulocytes 09/04/2024 0.04  0.00 - 0.07 K/uL Final   Performed at [facility], [address] [place], [place] [address]   Color, Urine 09/04/2024 YELLOW  YELLOW Final   APPearance 09/04/2024 CLOUDY (A)  CLEAR Final    Specific Gravity, Urine 09/04/2024 1.029  1.005 - 1.030 Final   pH 09/04/2024 5.0  5.0 - 8.0 Final   Glucose, UA 09/04/2024 NEGATIVE  NEGATIVE mg/dL Final   Hgb urine dipstick 09/04/2024 MODERATE (A)  NEGATIVE Final   Bilirubin Urine 09/04/2024 NEGATIVE  NEGATIVE Final   Ketones, ur 09/04/2024 80 (A)  NEGATIVE mg/dL Final   Protein, ur 88/80/7974 NEGATIVE  NEGATIVE mg/dL Final   Nitrite 88/80/7974 NEGATIVE  NEGATIVE Final   Leukocytes,Ua 09/04/2024 TRACE (A)  NEGATIVE Final   RBC / HPF 09/04/2024 21-50  0 - 5 RBC/hpf Final   WBC, UA 09/04/2024 11-20  0 - 5 WBC/hpf Final   Bacteria, UA 09/04/2024 RARE (A)  NONE SEEN Final   Squamous Epithelial / HPF 09/04/2024 6-10  0 - 5 /HPF Final   Mucus 09/04/2024 PRESENT   Final   Performed at [facility], [address] [place], [place] [address]   Opiates 09/04/2024 NONE DETECTED  NONE DETECTED Final   Cocaine 09/04/2024 NONE DETECTED  NONE DETECTED Final   Benzodiazepines 09/04/2024 NONE DETECTED  NONE DETECTED Final   Amphetamines 09/04/2024 NONE DETECTED  NONE DETECTED Final   Tetrahydrocannabinol 09/04/2024 NONE DETECTED  NONE DETECTED Final   Barbiturates 09/04/2024 NONE DETECTED  NONE DETECTED Final   Comment: (NOTE) DRUG SCREEN FOR MEDICAL PURPOSES ONLY.  IF CONFIRMATION IS NEEDED FOR ANY PURPOSE, NOTIFY LAB WITHIN 5 DAYS.  LOWEST DETECTABLE LIMITS FOR URINE DRUG SCREEN Drug Class                     Cutoff (ng/mL) Amphetamine and metabolites    1000 Barbiturate and metabolites    200 Benzodiazepine                 200 Opiates and metabolites  300 Cocaine and metabolites        300 THC                            50 Performed at [facility], [address], [place], [place] [address]    Alcohol, Ethyl (B) 09/04/2024 <15  <15 mg/dL Final   Comment: (NOTE) For medical purposes only. Performed at [facility], [address] [place], [place] [address]     PSYCHIATRIC REVIEW OF SYSTEMS (ROS)  ROS:  Notable for the following relevant positive findings: Review of Systems  Constitutional: Negative.   HENT: Negative.    Eyes: Negative.   Respiratory: Negative.    Cardiovascular: Negative.   Gastrointestinal: Negative.   Genitourinary: Negative.   Musculoskeletal: Negative.   Skin: Negative.   Neurological: Negative.   Endo/Heme/Allergies: Negative.   Psychiatric/Behavioral:  Positive for depression and hallucinations. The patient is nervous/anxious.     Additional findings:      Musculoskeletal: No abnormal movements observed      Gait & Station: Normal      Pain Screening: Denies      Nutrition & Dental Concerns: Reviewed   RISK FORMULATION/ASSESSMENT  Is the patient experiencing any suicidal or homicidal ideations: No       Explain if yes:   However, patient also described constant hallucinations, and implied that some of them may be command in nature  Protective factors considered for safety management:   Patient does not have family locally, and not clear that has any steady access to community resources  Risk factors/concerns considered for safety management:   Depression Impulsivity Isolation Unwillingness to seek help Unmarried  Is there a safety management plan with the patient and treatment team to minimize risk factors and promote protective factors: No           Explain: Patient without access to supportive resources locally, and not apparently in any psychiatric care  Is crisis care placement or psychiatric hospitalization recommended: Yes     Based on my current evaluation and risk assessment, patient is determined at this time to be at:  High risk  *RISK ASSESSMENT Risk assessment is a dynamic process; it is possible that this patient's condition, and risk level, may change. This should be re-evaluated and managed over time as appropriate. Please re-consult psychiatric consult services if additional assistance is needed in terms of risk assessment and  management. If your team decides to discharge this patient, please advise the patient how to best access emergency psychiatric services, or to call 911, if their condition worsens or they feel unsafe in any way.   Adriana JINNY Pontes, MD Telepsychiatry Consult Services

## 2024-09-05 ENCOUNTER — Encounter (HOSPITAL_COMMUNITY): Payer: Self-pay

## 2024-09-05 NOTE — ED Notes (Signed)
 Paperwork current for IVC

## 2024-09-05 NOTE — ED Provider Notes (Addendum)
 Emergency Medicine Observation Re-evaluation Note  Bonnell Placzek is a 28 y.o. female, seen on rounds today.  Pt initially presented to the ED for complaints of Altered Mental Status Currently, the patient is asleep.  She came in late last night after being brought in by police for throwing things out her apartment balcony.  She had been having hallucinations and has not been caring for herself.  She was seen by psych who put in medication recs as well as recommended inpatient psych placement.  Physical Exam  BP 134/79   Pulse 74   Temp 97.6 F (36.4 C) (Oral)   Resp 16   SpO2 100%  Physical Exam General: asleep Cardiac: rr Lungs: clear Psych: asleep  ED Course / MDM  EKG:   I have reviewed the labs performed to date as well as medications administered while in observation.  Recent changes in the last 24 hours include psych eval.  Plan  Current plan is for inpatient psych.    Dean Clarity, MD 09/05/24 [phone]  Pt has been accepted to [facility] by Dr. Millie Manners.  She remains stable for transfer.   Dean Clarity, MD 09/05/24 [phone]

## 2024-09-05 NOTE — ED Notes (Signed)
 Envelope [id number]; IVC uploaded to e file; copies on clipboard, copy with stickers in med rec basket in blue, original in red folder

## 2024-09-05 NOTE — ED Notes (Signed)
 Spoke with [facility] regarding this patient. [facility] states they will be speaking with the case manager and will see if they will be accepting her.

## 2024-09-05 NOTE — ED Notes (Signed)
 Transport contacted to take patient to [facility]. Transport states that an technical sales engineer will reach out shortly.

## 2024-09-05 NOTE — ED Notes (Signed)
 Pt. Came in IVC'd in the field; Dr. Raford did first exam at 12:15 AM; information being filled in and completed; paperwork in process

## 2024-09-05 NOTE — Progress Notes (Signed)
 Pt has been accepted to [facility] TODAY 09/05/2024),  Bed assignment: Main campus  Pt meets inpatient criteria per: Adriana Pontes MD  Attending Physician will be Millie Manners, MD  Report can be called to: [phone] (this is a pager, please leave call-back number when giving report)  Pt can arrive ASAP  Care Team Notified: Jerel Gravely NP, [place] RN   Tunisia Khylin Gutridge LCSW-A   09/05/2024 8:40 AM

## 2024-09-05 NOTE — Progress Notes (Signed)
 LCSW Progress Note  [id number]   Tammie Parker  09/05/2024  8:29 AM  Description:   Inpatient Psychiatric Referral  Patient was recommended inpatient per Adriana Pontes (NP). There are no available beds at [facility], per [facility] AC Noberto Qua RN). Patient was referred to the following out of network facilities: [facility] Provider Address Phone Fax  [facility]  [address] [place] [address] [phone]  [facility]  [address] [place] [address] [phone]  [facility]  [address] [place], [place] [address] [phone]  [facility]  [address] [place] [address] [phone]  [facility]  [address] [place] [address] [phone]  [facility]  [address] [place] [address] [phone]  [facility]  [address] [place] [address] [phone]  [facility] EFAX  [address] [place], [place] [phone]  [facility]  [address] [place] [address] [phone]  [facility]  [address] [place] [address] [phone]  [facility]  [address] [place] [address] [phone]      Situation ongoing, CSW to continue following and update chart as more information becomes available.      Tunisia Allani Reber, MSW, LCSW  09/05/2024 8:29 AM
